# Patient Record
Sex: Female | Born: 1965 | Race: White | Hispanic: No | Marital: Married | Smoking: Former smoker
Health system: Southern US, Community
[De-identification: ages and names within clinical notes are randomized; demographics above are authoritative.]

## PROBLEM LIST (undated history)

## (undated) DIAGNOSIS — B019 Varicella without complication: Secondary | ICD-10-CM

## (undated) DIAGNOSIS — N189 Chronic kidney disease, unspecified: Secondary | ICD-10-CM

## (undated) DIAGNOSIS — E785 Hyperlipidemia, unspecified: Secondary | ICD-10-CM

## (undated) DIAGNOSIS — E282 Polycystic ovarian syndrome: Secondary | ICD-10-CM

## (undated) DIAGNOSIS — E039 Hypothyroidism, unspecified: Secondary | ICD-10-CM

## (undated) DIAGNOSIS — D259 Leiomyoma of uterus, unspecified: Secondary | ICD-10-CM

## (undated) DIAGNOSIS — D649 Anemia, unspecified: Secondary | ICD-10-CM

## (undated) DIAGNOSIS — G43909 Migraine, unspecified, not intractable, without status migrainosus: Secondary | ICD-10-CM

## (undated) DIAGNOSIS — C801 Malignant (primary) neoplasm, unspecified: Secondary | ICD-10-CM

## (undated) DIAGNOSIS — K2970 Gastritis, unspecified, without bleeding: Secondary | ICD-10-CM

## (undated) DIAGNOSIS — I10 Essential (primary) hypertension: Secondary | ICD-10-CM

## (undated) HISTORY — DX: Migraine, unspecified, not intractable, without status migrainosus: G43.909

## (undated) HISTORY — PX: OTHER SURGICAL HISTORY: SHX169

## (undated) HISTORY — PX: DILATION AND CURETTAGE OF UTERUS: SHX78

## (undated) HISTORY — DX: Hyperlipidemia, unspecified: E78.5

## (undated) HISTORY — PX: CARPAL TUNNEL RELEASE: SHX101

## (undated) HISTORY — DX: Essential (primary) hypertension: I10

---

## 2004-06-08 ENCOUNTER — Ambulatory Visit: Payer: Self-pay | Admitting: Family Medicine

## 2006-08-03 ENCOUNTER — Ambulatory Visit: Payer: Self-pay | Admitting: Unknown Physician Specialty

## 2007-02-20 ENCOUNTER — Ambulatory Visit: Payer: Self-pay | Admitting: Unknown Physician Specialty

## 2007-03-02 ENCOUNTER — Ambulatory Visit: Payer: Self-pay | Admitting: Unknown Physician Specialty

## 2007-12-11 ENCOUNTER — Ambulatory Visit: Payer: Self-pay | Admitting: Unknown Physician Specialty

## 2008-12-16 ENCOUNTER — Ambulatory Visit: Payer: Self-pay | Admitting: Unknown Physician Specialty

## 2009-01-20 ENCOUNTER — Ambulatory Visit: Payer: Self-pay | Admitting: Internal Medicine

## 2009-02-04 ENCOUNTER — Ambulatory Visit: Payer: Self-pay | Admitting: Internal Medicine

## 2009-02-19 ENCOUNTER — Ambulatory Visit: Payer: Self-pay | Admitting: Internal Medicine

## 2009-04-22 ENCOUNTER — Ambulatory Visit: Payer: Self-pay | Admitting: Internal Medicine

## 2009-05-14 ENCOUNTER — Ambulatory Visit: Payer: Self-pay | Admitting: Internal Medicine

## 2009-05-22 ENCOUNTER — Ambulatory Visit: Payer: Self-pay | Admitting: Internal Medicine

## 2009-11-20 ENCOUNTER — Ambulatory Visit: Payer: Self-pay | Admitting: Internal Medicine

## 2009-12-09 ENCOUNTER — Ambulatory Visit: Payer: Self-pay | Admitting: Internal Medicine

## 2009-12-20 ENCOUNTER — Ambulatory Visit: Payer: Self-pay | Admitting: Internal Medicine

## 2010-02-09 ENCOUNTER — Ambulatory Visit: Payer: Self-pay | Admitting: Unknown Physician Specialty

## 2010-07-02 ENCOUNTER — Ambulatory Visit: Payer: Self-pay | Admitting: Unknown Physician Specialty

## 2011-03-29 ENCOUNTER — Ambulatory Visit: Payer: Self-pay | Admitting: Family Medicine

## 2012-02-22 ENCOUNTER — Ambulatory Visit: Payer: Self-pay | Admitting: Unknown Physician Specialty

## 2012-02-22 LAB — HCG, QUANTITATIVE, PREGNANCY: Beta Hcg, Quant.: <1 m[IU]/mL — ABNORMAL LOW

## 2012-07-30 ENCOUNTER — Ambulatory Visit: Payer: Self-pay | Admitting: Specialist

## 2012-08-14 ENCOUNTER — Ambulatory Visit: Payer: Self-pay | Admitting: Specialist

## 2012-09-06 ENCOUNTER — Ambulatory Visit: Payer: Self-pay | Admitting: Family Medicine

## 2013-08-07 ENCOUNTER — Ambulatory Visit: Payer: Self-pay | Admitting: Specialist

## 2013-08-07 LAB — BASIC METABOLIC PANEL
Anion Gap: 5 — ABNORMAL LOW (ref 7–16)
BUN: 8 mg/dL (ref 7–18)
Calcium, Total: 9.4 mg/dL (ref 8.5–10.1)
Chloride: 105 mmol/L (ref 98–107)
Co2: 29 mmol/L (ref 21–32)
EGFR (African American): >60
EGFR (Non-African Amer.): >60
Potassium: 3.6 mmol/L (ref 3.5–5.1)
Sodium: 139 mmol/L (ref 136–145)

## 2013-08-16 ENCOUNTER — Ambulatory Visit: Payer: Self-pay | Admitting: Specialist

## 2013-08-22 DIAGNOSIS — C801 Malignant (primary) neoplasm, unspecified: Secondary | ICD-10-CM

## 2013-08-22 HISTORY — DX: Malignant (primary) neoplasm, unspecified: C80.1

## 2014-01-20 HISTORY — PX: APPENDECTOMY: SHX54

## 2014-02-10 ENCOUNTER — Observation Stay: Payer: Self-pay | Admitting: Surgery

## 2014-02-10 LAB — CBC
HCT: 39.8 % (ref 35.0–47.0)
HGB: 13.5 g/dL (ref 12.0–16.0)
MCH: 30.6 pg (ref 26.0–34.0)
MCHC: 34 g/dL (ref 32.0–36.0)
MCV: 90 fL (ref 80–100)
Platelet: 302 10*3/uL (ref 150–440)
RBC: 4.42 10*6/uL (ref 3.80–5.20)
RDW: 13.8 % (ref 11.5–14.5)
WBC: 27.9 10*3/uL — AB (ref 3.6–11.0)

## 2014-02-10 LAB — COMPREHENSIVE METABOLIC PANEL
ALBUMIN: 4 g/dL (ref 3.4–5.0)
ALT: 34 U/L (ref 12–78)
ANION GAP: 12 (ref 7–16)
Alkaline Phosphatase: 99 U/L
BILIRUBIN TOTAL: 0.8 mg/dL (ref 0.2–1.0)
BUN: 12 mg/dL (ref 7–18)
Calcium, Total: 9.6 mg/dL (ref 8.5–10.1)
Chloride: 101 mmol/L (ref 98–107)
Co2: 26 mmol/L (ref 21–32)
Creatinine: 1.01 mg/dL (ref 0.60–1.30)
EGFR (African American): >60
EGFR (Non-African Amer.): >60
GLUCOSE: 143 mg/dL — AB (ref 65–99)
OSMOLALITY: 280 (ref 275–301)
Potassium: 3.2 mmol/L — ABNORMAL LOW (ref 3.5–5.1)
SGOT(AST): 27 U/L (ref 15–37)
Sodium: 139 mmol/L (ref 136–145)
TOTAL PROTEIN: 7.8 g/dL (ref 6.4–8.2)

## 2014-02-10 LAB — URINALYSIS, COMPLETE
BACTERIA: NONE SEEN
BILIRUBIN, UR: NEGATIVE
Blood: NEGATIVE
Hyaline Cast: 2
LEUKOCYTE ESTERASE: NEGATIVE
Nitrite: NEGATIVE
Ph: 7 (ref 4.5–8.0)
Protein: NEGATIVE
RBC,UR: 3 /HPF (ref 0–5)
SPECIFIC GRAVITY: 1.021 (ref 1.003–1.030)
Squamous Epithelial: 1
WBC UR: 1 /HPF (ref 0–5)

## 2014-02-10 LAB — HCG, QUANTITATIVE, PREGNANCY: Beta Hcg, Quant.: <1 m[IU]/mL — ABNORMAL LOW

## 2014-02-10 LAB — LIPASE, BLOOD: LIPASE: 244 U/L (ref 73–393)

## 2014-02-10 LAB — WET PREP, GENITAL

## 2014-02-10 LAB — GC/CHLAMYDIA PROBE AMP

## 2014-02-11 LAB — PATHOLOGY REPORT

## 2014-02-15 LAB — CULTURE, BLOOD (SINGLE)

## 2014-04-22 HISTORY — PX: PARTIAL NEPHRECTOMY: SHX414

## 2014-12-09 NOTE — Op Note (Signed)
PATIENT NAMECHIKITA, Teresa Short MR#:  [id number] DATE OF BIRTH:  31-Jan-1966  DATE OF PROCEDURE:  08/14/2012  PREOPERATIVE DIAGNOSIS: Right carpal tunnel syndrome.   POSTOPERATIVE DIAGNOSIS: Right carpal tunnel syndrome.   PROCEDURE: Right carpal tunnel release.   SURGEON: Christophe Louis, M.D.   ANESTHESIA: General.   COMPLICATIONS: None.   TOURNIQUET TIME: 12 minutes.   DESCRIPTION OF PROCEDURE: After adequate induction of general anesthesia, the right upper extremity is thoroughly prepped with alcohol and ChloraPrep and draped in standard sterile fashion. The extremity is wrapped out with the Esmarch bandage and pneumatic tourniquet elevated to 250 mmHg. Under loupe magnification, standard volar carpal tunnel incision is made and the dissection carefully carried down to the transverse retinacular ligament. This is incised in the midportion. The distal release is performed with the small scissors. The proximal release is performed with the small scissors and the carpal tunnel scissors. There is seen to be moderate compression of the nerve directly beneath the ligament. Careful search is made both proximally and distally to ensure that complete release had been obtained. The wound is thoroughly irrigated multiple times. Skin edges are infiltrated with 0.5% plain Marcaine. The skin is closed with 4-0 nylon. A soft bulky dressing is applied. The tourniquet is released. The patient is returned to the recovery room in satisfactory condition having tolerated the procedure quite well.   ____________________________ Lucas Mallow, MD ces:jm D: 08/14/2012 14:05:46 ET T: 08/14/2012 14:40:10 ET JOB#: [id number]  cc: Lucas Mallow, MD, <Dictator> Lucas Mallow MD ELECTRONICALLY SIGNED 08/14/2012 15:06

## 2014-12-12 NOTE — Op Note (Signed)
PATIENT NAMEAMESHIA, Teresa Short MR#:  [id number] DATE OF BIRTH:  1965-09-05  DATE OF PROCEDURE:  08/16/2013  PREOPERATIVE DIAGNOSIS: Left carpal tunnel syndrome.   POSTOPERATIVE DIAGNOSIS: Left carpal tunnel syndrome.   PROCEDURE: Left carpal tunnel release.   SURGEON: Christophe Louis, M.D.   ANESTHESIA: General.   COMPLICATIONS: None.   TOURNIQUET TIME: Approximately 15 minutes.   DESCRIPTION OF PROCEDURE: After adequate induction of general anesthesia, the left upper extremity is thoroughly prepped with alcohol and ChloraPrep and draped in standard sterile fashion. The extremity is wrapped out with the Esmarch bandage and pneumatic tourniquet elevated to 250 mmHg. Under loupe magnification, standard volar carpal tunnel incision is made. The dissection is carefully carried down to the transverse retinacular ligament. This is incised in the midportion with the knife. The proximal release is performed with the small scissors and the carpal tunnel scissors. The distal release is performed with the small scissors. There is seen to be moderate compression of the nerve directly beneath the ligament. There is mild synovitis present. There is no mass lesion present. Careful check is made both proximally and distally to ensure that complete release had been obtained. The wound is thoroughly irrigated multiple times. Skin edges are infiltrated with 0.5% plain Marcaine. The skin is closed with 4-0 nylon. A soft bulky dressing is applied. The tourniquet is released. The patient is returned to the recovery room in satisfactory condition having tolerated the procedure quite well.   ____________________________ Lucas Mallow, MD ces:aw D: 08/16/2013 08:33:53 ET T: 08/16/2013 08:44:01 ET JOB#: [id number]  cc: Lucas Mallow, MD, <Dictator> Lucas Mallow MD ELECTRONICALLY SIGNED 08/17/2013 14:11

## 2014-12-13 NOTE — H&P (Signed)
PATIENT NAMEPARISS, HOMMES MR#:  [id number] DATE OF BIRTH:  [birth year]  DATE OF ADMISSION:  02/10/2014  CHIEF COMPLAINT: Right flank pain.   HISTORY OF PRESENT ILLNESS: This is a patient with a right flank pain that started more in the right upper quadrant and is now in the right lower quadrant and right flank that started yesterday morning at 8:00 a.m. She had no problems the day before. She has never had an episode like this before. She states that it is worsening. She has had nausea and a single emesis before coming to the hospital. Denies fevers or chills. Denies hematuria.   A work-up in the Emergency Room has been with an ultrasound and a CT scan. CT scan confirms the presence of  acute appendicitis, but also shows a left renal mass suspicious for left renal cell carcinoma.   The patient is aware of these findings.   PAST MEDICAL HISTORY: Hypertension, hypothyroidism, obesity, tobacco abuse, but she states that she is stopping today.   PAST SURGICAL HISTORY: Bilateral carpal tunnel surgery, tonsillectomy.   ALLERGIES: The patient states that she had known medical allergies, but on her intake she shows [place].   MEDICATIONS: Multiple, see chart and med reconciliation.   FAMILY HISTORY: Noncontributory.   SOCIAL HISTORY: The patient is a Pharmacist, hospital. Smokes six cigarettes per day, but states that she is stopping today. Does not drink alcohol.   REVIEW OF SYSTEMS: Ten system review is performed and negative with the exception of that mentioned in the history of present illness.   PHYSICAL EXAMINATION: GENERAL: Morbidly obese female patient with a BMI of 37, 240 pounds, 68 inches tall.  VITAL SIGNS: Temperature of 97.7, pulse of 87, respirations 18, blood pressure 139/75. Pain scale of 0 recorded in the nursing notes, but she clearly has pain and is tender. See below. Her pain scale was a 10. HEENT: Shows no scleral icterus.  NECK: No palpable neck nodes.  CHEST: Clear to  auscultation.  CARDIAC: Regular rate and rhythm.  ABDOMEN: Soft. There is tenderness in the right lower quadrant with positive percussion tenderness and some guarding and some rebound tenderness. Questionable Rovsing sign is present as well. No scars are noted.  EXTREMITIES: Without edema.  NEUROLOGIC: Grossly intact.  INTEGUMENT: No jaundice.   LABORATORY VALUES: An ultrasound of the pelvis is reviewed, showing ovarian cyst bilaterally.   CT scan of the abdomen and pelvis demonstrates what appears to be acute appendicitis. There is also a 3.5 cm left renal mass suspicious for renal cell carcinoma. No hydroureter.   Urinalysis shows 3 red blood cells but no gross blood.   Electrolytes are within normal limits with the exception of serum potassium of 3.2. White blood cell count is 28,000, H and H of 13.5 and 40 and a platelet count of 302.   ASSESSMENT AND PLAN: This is a patient with classic symptoms of acute appendicitis. She is most tender at McBurney's point, but her pain and tenderness radiates to her right flank. I have recommended laparoscopic appendectomy. The rationale for this has been discussed with she and her family. The options of observation have been reviewed. The risks of rupture has been reviewed and the risks of bleeding, infection, recurrent symptoms, failure to resolve her symptoms, negative laparoscopy and conversion to an open procedure have all been reviewed. She and her family understood and agreed to proceed. We will admit to the hospital and scheduled for Dr. Leanora Cover later this morning.   Also  of note is the coincidental findings of a left renal mass. The patient is aware of these findings I discussed with her possible urology consult while she is in the hospital versus outpatient versus tertiary care referral of which she would prefer [facility] if she were to be referred out of [facility] for this care. She is aware that this is suspicious for a cancer  and needs follow-up, as is her family.   ____________________________ Jerrol Banana Burt Knack, MD rec:sg D: 02/10/2014 05:10:22 ET T: 02/10/2014 06:20:56 ET JOB#: [id number]  cc: Jerrol Banana. Burt Knack, MD, <Dictator> Florene Glen MD ELECTRONICALLY SIGNED 02/17/2014 7:34

## 2014-12-13 NOTE — Consult Note (Signed)
Patient name: Teresa Short  Date of birth: [birth year]  Date of consult: February 11, 2014  Reason for consult: Left renal mass  History:  Ms. Grilli is a 49 year old white female who presented with right sided flank pain.  She underwent evaluation by general surgery.  She was found to have acute appendicitis.  She underwent evaluation including a CT scan.  This was performed with contrast.  This demonstrated findings consistent with acute appendicitis.  She was also found to have a 3.5 cm mass in the anterior portion of the left kidney.  It has changes worrisome for possible renal cell carcinoma.  There is no significant perirenal or aortic adenopathy.  The lower lung fields demonstrate no evidence of metastasis.  The study performed was not optimal for evaluation of the renal tumor.  A triphasic CT scan is more appropriate for complete evaluation.  She has had no significant flank pain or other symptoms to suggest the tumor.  She denies any known family history of renal cell carcinoma.  Her maternal grandmother underwent a nephrectomy for unknown reasons.  It was not felt to be malignancy related.  She has had no other imaging for comparison in the recent past.  The findings of a renal mass have been discussed in detail.  Malignant and benign lesions were discussed in detail.  The potential need for further intervention was also discussed.  The options of radical nephrectomy, partial nephrectomy, or cryotherapy was discussed.  Given her age and health and size of tumor, partial nephrectomy would be the gold standard of therapy.  She does have a risk factor of tobacco use.  She denies any other significant prior urological history.       Past medical history: Hypertension, hypothyroidism, obesity, tobacco use.  Past surgical history: Appendectomy, bilateral carpal tunnel surgery, tonsillectomy  Social history: Patient has a long history of tobacco use.  She denies any significant alcohol or drug  use.  Family history: Noncontributory  Medications on admission: Zyrtec 10 mg daily, vitamin D3 1000 units daily, vitamin B-12 1000 g daily, Tylenol PM once at bedtime, tramadol 50 mg 2 tabs every 4 hours as needed, multivitamin once daily, levothyroxine 75 g daily, hydrochlorothiazide 25 mg daily, calcium with vitamin D 1 tab daily, BuSpar 150 mg twice daily, Drisdol 7.5 mg daily, biotin 1000 g daily  Allergies: Triaminic, latex  Physical examination:      Temp: 97.5 heart rate: 69  respiratory rate: 16  blood pressure: 132/82 Gen.: Awake, alert, oriented 4 HEENT: Within normal limits Chest: Clear to auscultation bilaterally.  No abnormal breath sounds Cardiovascular: Regular rate and rhythm Abdomen: Soft, mildly tender in the right lower quadrant, nondistended, no palpable masses, no appreciable CVA tenderness GU: Within normal limits Extremities: Free range of motion 4 Neuro: Motor and sensory grossly intact  Assessment: Left 3.5 cm renal mass  Recommendation: Further evaluation will be needed with a triphasic CT scan of the abdomen for better definition of the tumor and enhancement.  Given the size of the lesion, renal cell carcinoma is of most concern. The history of smoking also increases the probability of malignancy.  The option of radical nephrectomy, partial nephrectomy, or cryotherapy has been discussed in detail.  We will further evaluate the lesion prior to making a final decision.  We will give her some time to recover from the appendectomy.  We will schedule a triphasic CT scan of the abdomen in the next 2 weeks.  She is to follow-up  after the CT scan for interpretation and further recommendation of treatment.  We will likely refer to [place] for glottic partial nephrectomy.  She is in agreement with this course of action.  She is to notify us if there are any further problems or questions in the interim.  Electronic Signatures: Murrell Redden (MD)  (Signed on  23-Jun-15 11:54)  Authored  Last Updated: 23-Jun-15 11:54 by Murrell Redden (MD)

## 2014-12-13 NOTE — Consult Note (Signed)
Pt seen, Chart Reviewed. Notes Reviewed. Note DIctated. Left Renal Mass  Pt will need dedicated triphasic renal CT for better evaluation of the renal mass.  Worrisome for renal cell carcinoma.  Will arrange CT outpatient in the next two weeks with follow up afterwards.  Will likley need robotic partial nephrectomy.  We will arrange at [facility] in the near future.   Electronic Signatures: Murrell Redden (MD)  (Signed on 23-Jun-15 08:19)  Authored  Last Updated: 23-Jun-15 08:19 by Murrell Redden (MD)

## 2014-12-13 NOTE — H&P (Signed)
Subjective/Chief Complaint rt flank pain   History of Present Illness 18 hrs rt flank pain, started RUQ now in RLQ. No prior episode, no f/c no hematuria nausea and emesis prehospital   Past History PMH HTN, hypothyrouid obesity PSH carpal tunnel bilat, T&A   Past Medical Health Hypertension, Smoking   Past Med/Surgical Hx:  anemia:   thyroid:   Carpal Tunnel Release:   tonsillectomy:   uterine abblation:   ALLERGIES:  Triaminic: Hives  Latex: Rash, Itching  Family and Social History:  Family History Non-Contributory   Social History positive  tobacco, positive tobacco (Greater than 1 year), negative ETOH, teacher   + Tobacco Current (within 1 year)   Place of Living Home   Review of Systems:  Fever/Chills No   Cough No   Abdominal Pain Yes   Diarrhea No   Constipation No   Nausea/Vomiting Yes   SOB/DOE No   Chest Pain No   Dysuria No   Tolerating Diet No  Nauseated  Vomiting   Medications/Allergies Reviewed Medications/Allergies reviewed   Physical Exam:  GEN no acute distress, obese   HEENT pink conjunctivae   NECK supple   RESP normal resp effort  clear BS  postive use of accessory muscles   CARD regular rate   ABD positive tenderness  soft  RLQ with perc tenderness   LYMPH negative neck   EXTR negative edema   SKIN normal to palpation   PSYCH alert, A+O to time, place, person, good insight   Lab Results: Hepatic:  22-Jun-15 00:35   Bilirubin, Total 0.8  Alkaline Phosphatase 99 (45-117 NOTE: New Reference Range 07/12/13)  SGPT (ALT) 34  SGOT (AST) 27  Total Protein, Serum 7.8  Albumin, Serum 4.0  Routine Micro:  22-Jun-15 01:08   Micro Text Report CHLAM/N.GC RT-PCR ([facility])   CHLAMYDIA                 CHLAMYDIA TRACHOMATIS NEGATIVE   N.GONORRHOEAE             N.GONORRHOEAE NEGATIVE   ANTIBIOTIC                       Micro Text Report WET PREP   COMMENT                   RARE WHITE BLOOD CELLS SEEN   COMMENT                    NO TRICHOMONAS,SPERMATOZOA,YEAST,OR CLUE CELLS SEEN   ANTIBIOTIC                       Comment 1. RARE WHITE BLOOD CELLS SEEN  Comment 2. NO TRICHOMONAS,SPERMATOZOA,YEAST,OR CLUE CELLS SEEN  Result(s) reported on 10 Feb 2014 at 01:43AM.  Routine Chem:  22-Jun-15 00:35   Glucose, Serum  143  BUN 12  Creatinine (comp) 1.01  Sodium, Serum 139  Potassium, Serum  3.2  Chloride, Serum 101  CO2, Serum 26  Calcium (Total), Serum 9.6  Osmolality (calc) 280  eGFR (African American) >60  eGFR (Non-African American) >60 (eGFR values <79m/min/1.73 m2 may be an indication of chronic kidney disease (CKD). Calculated eGFR is useful in patients with stable renal function. The eGFR calculation will not be reliable in acutely ill patients when serum creatinine is changing rapidly. It is not useful in  patients on dialysis. The eGFR calculation may not be applicable to patients at the low  and high extremes of body sizes, pregnant women, and vegetarians.)  Anion Gap 12  HCG Betasubunit Quant. Serum  < 1 (1-3  (International Unit)  ----------------- Non-pregnant <5 Weeks Post LMP mIU/mL  3- 4 wk 9 - 130  4- 5 wk 75 - 2,600  5- 6 wk 850 - 20,800  6- 7 wk 4,000 - 100,000  7-12 wk 11,500 - 289,000 12-16 wk 18,000 - 137,000 16-29 wk 1,400 - 53,000 29-41 wk 940 - 60,000)  Lipase 244 (Result(s) reported on 10 Feb 2014 at 01:14AM.)  Routine UA:  22-Jun-15 02:46   Color (UA) Yellow  Clarity (UA) Cloudy  Glucose (UA) 50 mg/dL  Bilirubin (UA) Negative  Ketones (UA) Trace  Specific Gravity (UA) 1.021  Blood (UA) Negative  pH (UA) 7.0  Protein (UA) Negative  Nitrite (UA) Negative  Leukocyte Esterase (UA) Negative (Result(s) reported on 10 Feb 2014 at 04:03AM.)  RBC (UA) 3 /HPF  WBC (UA) 1 /HPF  Bacteria (UA) NONE SEEN  Epithelial Cells (UA) 1 /HPF  Mucous (UA) PRESENT  Hyaline Cast (UA) 2 /LPF  Amorphous Crystal (UA) PRESENT (Result(s) reported on 10 Feb 2014 at 04:03AM.)   Routine Hem:  22-Jun-15 00:35   WBC (CBC)  27.9  RBC (CBC) 4.42  Hemoglobin (CBC) 13.5  Hematocrit (CBC) 39.8  Platelet Count (CBC) 302 (Result(s) reported on 10 Feb 2014 at 01:40AM.)  MCV 90  MCH 30.6  MCHC 34.0  RDW 13.8   Radiology Results: [place]:    22-Jun-15 02:12, US Pelvis Ultrasound Exam with Transvaginal - NON-OB  US Pelvis Ultrasound Exam with Transvaginal - NON-OB  REASON FOR EXAM:    R lower quad and flank pain, history of ovarian cysts  COMMENTS:   LMP: 9 years ago    PROCEDURE: [place]  - US PELVIS EXAM W/TRANSVAGINAL  - Feb 10 2014  2:12AM     CLINICAL DATA:  Right pelvic pain, history of ovarian cyst.    EXAM:  TRANSABDOMINAL AND TRANSVAGINAL ULTRASOUND OF PELVIS    TECHNIQUE:  Both transabdominal and transvaginal ultrasound examinations of the  pelvis were performed. Transabdominal technique was performed for  global imaging of the pelvis including uterus,ovaries, adnexal  regions, and pelvic cul-de-sac. It was necessary to proceed with  endovaginal exam following the transabdominal exam to visualize the  adnexa.    COMPARISON:  Ultrasound July 02, 2010    FINDINGS:  Uterus    Measurements: 7.1 x 3.2 x 4.2 cm. 3.5 x 2.8 cm fundal intramural  slightly echogenic vascular leiomyoma. Subcentimeter leiomyoma  within the lower uterine segment is likely intramural. Nabothian  cysts at the cervix.    Endometrium  Thickness: 6 mm.  No focal abnormalityvisualized.    Right ovary    Measurements: 2.4 x 1.7 x 4.5 cm. 2.1 x 2 cm right parovarian cyst.    Left ovary    Measurements: 5.1 x 2.5 x 3.5 cm. 4 x 2.3 x 3.5 cm anechoic  avascular left adnexal cyst with increased through transmission.    Other findings    No free fluid.   IMPRESSION:  Bilateral benign-appearing adnexal cyst, measuring up to 4 cm on the  left. Slightly septated 2.1 x 2 cm right parovarian cyst, Stable is  slightly increased in size.    Multiple uterine  leiomyomas.      Electronically Signed    By: Elon Alas    On: 02/10/2014 03:20         Verified By: Carney Corners.  BLOOMER, M.D.,  CT:    22-Jun-15 04:04, CT Abdomen and Pelvis With Contrast  CT Abdomen and Pelvis With Contrast  REASON FOR EXAM:    (1) RLQ pain, elevated WBC count; (2) RLQ pain,   elevated WBC count  COMMENTS:   May transport without cardiac monitor    PROCEDURE: CT  - CT ABDOMEN / PELVIS  W  - Feb 10 2014  4:04AM     CLINICAL DATA:  Right back pain and flank pain all day. Nausea and  vomiting. Elevated white cell count.    EXAM:  CT ABDOMEN AND PELVIS WITH CONTRAST    TECHNIQUE:  Multidetector CT imaging of the abdomen and pelvis was performed  using the standard protocol following bolus administration of  intravenous contrast.    CONTRAST:  100 mL Isovue 300    COMPARISON:  Ultrasound pelvis 02/10/2014    FINDINGS:  The lung bases are clear.    Diffuse fatty infiltration of the liver. The gallbladder, pancreas,  spleen, adrenal glands, abdominal aorta, inferior vena cava, and  retroperitoneal lymph nodes are unremarkable. There is a solid  heterogeneously hypo enhancing mass in the midpole of the left  kidney measuring 3.5 cm diameter. The appearance is suspicious for  renal cell carcinoma. No hydronephrosis in either kidney. No  retroperitoneal lymphadenopathy. No evidence of venous tumor  invasion. The stomach, small bowel, and colon are decompressed. No  free air or free fluid in the abdomen.    Pelvis: The appendix is distended with appendiceal diameter  measuring about 15 mm. There is periappendiceal infiltration and  edema. Changes are consistent with acute appendicitis. No evidence  of periappendiceal abscess or loculated fluid collection. Nodular  appearance of the uterus consistent with fibroids. No abnormal  adnexal masses. No free or loculated pelvic fluid collections. No  destructive bone lesions.     IMPRESSION:  1.   Changes of acute appendicitis without abscess.  2. 3.5 cm mass in the left kidney consistent with renal cell  carcinoma.    3.  Fatty infiltration of the liver.      Electronically Signed    By: Lucienne Capers M.D.    On: 02/10/2014 04:12         Verified By: Neale Burly, M.D.,    Assessment/Admission Diagnosis acute appendicitis byu Hx, PE and confirmed with CT scan rec lap appy; options rationale and risks in detail VTE prophylaxis in obese smoker  also has inc finding of left renal mass; will ask for urology consult. susp for RCCa   Electronic Signatures: Florene Glen (MD)  (Signed 22-Jun-15 05:18)  Authored: CHIEF COMPLAINT and HISTORY, PAST MEDICAL/SURGIAL HISTORY, ALLERGIES, FAMILY AND SOCIAL HISTORY, REVIEW OF SYSTEMS, PHYSICAL EXAM, LABS, Radiology, ASSESSMENT AND PLAN   Last Updated: 22-Jun-15 05:18 by Florene Glen (MD)

## 2014-12-13 NOTE — Op Note (Signed)
PATIENT NAMESYANNE, Teresa Short MR#:  [id number] DATE OF BIRTH:  July 06, 1966  DATE OF PROCEDURE:  02/10/2014  PREOPERATIVE DIAGNOSIS:  Acute appendicitis.   POSTOPERATIVE DIAGNOSIS:  Acute appendicitis.   SURGEON:  Consuela Mimes, M.D.   ANESTHESIA:  General.   PROCEDURE IN DETAIL:  The patient was placed supine on the operating room table and prepped and draped in the usual sterile fashion. A Hasson cannula was introduced amidst horizontal mattress sutures of 0 Vicryl in the supraumbilical midline and a 15 mmHg CO2 pneumoperitoneum was created. Two additional 5 mm trocars were placed under direct visualization. There were some adhesions to the cecum and ascending colon in the right flank parietal peritoneum that appeared chronic, and these were taken down with the Harmonic scalpel, and then the appendix was located. There was a little bit of fibrinous exudate on it, but it was not ruptured and not dead. The mesoappendix was divided with the Harmonic scalpel and an appendectomy was performed just where the appendix met the cecum at its very base with an Endo GIA stapling device. The appendix was placed in an Endo Catch bag and extracted from the abdomen via the supraumbilical port site. The right lower quadrant was irrigated with copious amounts of warm normal saline and this was completely suctioned out including in the pelvis where there was no pus. The patient was noted to have a moderate-sized fibroid tumor of the uterus on the fundus of the uterus, as well as a right paratubal cyst, which was about 1 cm in diameter and appeared completely benign. The omentum was dragged over top of the appendiceal stump, as was some antimesenteric fat from the terminal ileum, and the peritoneum was desufflated and decannulated. The linea alba was closed with a single interrupted 0 PDS suture and the previously placed 0 Vicryls and all 3 skin sites were closed with subcuticular 5-0 Monocryl and suture strips. The  patient tolerated the procedure well and there were no complications.     ____________________________ Consuela Mimes, MD wfm:dmm D: 02/10/2014 10:54:48 ET T: 02/10/2014 11:06:02 ET JOB#: [id number]  cc: Consuela Mimes, MD, <Dictator> Consuela Mimes MD ELECTRONICALLY SIGNED 02/10/2014 11:29

## 2014-12-23 ENCOUNTER — Other Ambulatory Visit: Payer: Self-pay | Admitting: Nurse Practitioner

## 2014-12-23 DIAGNOSIS — R1011 Right upper quadrant pain: Secondary | ICD-10-CM

## 2014-12-23 DIAGNOSIS — R110 Nausea: Secondary | ICD-10-CM

## 2014-12-24 ENCOUNTER — Ambulatory Visit
Admission: RE | Admit: 2014-12-24 | Discharge: 2014-12-24 | Disposition: A | Discharge status: Home / Self Care | Payer: BLUE CROSS/BLUE SHIELD | Source: Ambulatory Visit | Attending: Nurse Practitioner | Admitting: Nurse Practitioner

## 2014-12-24 DIAGNOSIS — R110 Nausea: Secondary | ICD-10-CM | POA: Diagnosis not present

## 2014-12-24 DIAGNOSIS — K760 Fatty (change of) liver, not elsewhere classified: Secondary | ICD-10-CM | POA: Diagnosis not present

## 2014-12-24 DIAGNOSIS — R1011 Right upper quadrant pain: Secondary | ICD-10-CM | POA: Insufficient documentation

## 2015-02-20 ENCOUNTER — Encounter: Payer: Self-pay | Admitting: *Deleted

## 2015-02-24 ENCOUNTER — Encounter: Payer: Self-pay | Admitting: *Deleted

## 2015-02-24 ENCOUNTER — Ambulatory Visit: Payer: BLUE CROSS/BLUE SHIELD | Admitting: Anesthesiology

## 2015-02-24 ENCOUNTER — Ambulatory Visit
Admission: RE | Admit: 2015-02-24 | Discharge: 2015-02-24 | Disposition: A | Discharge status: Home / Self Care | Payer: BLUE CROSS/BLUE SHIELD | Source: Ambulatory Visit | Attending: Gastroenterology | Admitting: Gastroenterology

## 2015-02-24 ENCOUNTER — Encounter
Admission: RE | Disposition: A | Discharge status: Home / Self Care | Payer: Self-pay | Source: Ambulatory Visit | Attending: Gastroenterology

## 2015-02-24 DIAGNOSIS — R1013 Epigastric pain: Secondary | ICD-10-CM | POA: Insufficient documentation

## 2015-02-24 DIAGNOSIS — Z791 Long term (current) use of non-steroidal anti-inflammatories (NSAID): Secondary | ICD-10-CM | POA: Diagnosis not present

## 2015-02-24 DIAGNOSIS — E039 Hypothyroidism, unspecified: Secondary | ICD-10-CM | POA: Insufficient documentation

## 2015-02-24 DIAGNOSIS — R109 Unspecified abdominal pain: Secondary | ICD-10-CM | POA: Diagnosis present

## 2015-02-24 DIAGNOSIS — E282 Polycystic ovarian syndrome: Secondary | ICD-10-CM | POA: Diagnosis not present

## 2015-02-24 DIAGNOSIS — R110 Nausea: Secondary | ICD-10-CM | POA: Diagnosis present

## 2015-02-24 DIAGNOSIS — K219 Gastro-esophageal reflux disease without esophagitis: Secondary | ICD-10-CM | POA: Diagnosis not present

## 2015-02-24 DIAGNOSIS — Z79899 Other long term (current) drug therapy: Secondary | ICD-10-CM | POA: Diagnosis not present

## 2015-02-24 DIAGNOSIS — K224 Dyskinesia of esophagus: Secondary | ICD-10-CM | POA: Diagnosis not present

## 2015-02-24 DIAGNOSIS — Z91040 Latex allergy status: Secondary | ICD-10-CM | POA: Insufficient documentation

## 2015-02-24 DIAGNOSIS — K2900 Acute gastritis without bleeding: Secondary | ICD-10-CM | POA: Insufficient documentation

## 2015-02-24 HISTORY — DX: Anemia, unspecified: D64.9

## 2015-02-24 HISTORY — DX: Hypothyroidism, unspecified: E03.9

## 2015-02-24 HISTORY — DX: Polycystic ovarian syndrome: E28.2

## 2015-02-24 HISTORY — DX: Leiomyoma of uterus, unspecified: D25.9

## 2015-02-24 HISTORY — PX: ESOPHAGOGASTRODUODENOSCOPY: SHX5428

## 2015-02-24 HISTORY — DX: Malignant (primary) neoplasm, unspecified: C80.1

## 2015-02-24 HISTORY — DX: Varicella without complication: B01.9

## 2015-02-24 SURGERY — EGD (ESOPHAGOGASTRODUODENOSCOPY)
Anesthesia: General

## 2015-02-24 MED ORDER — MIDAZOLAM HCL 2 MG/2ML IJ SOLN
INTRAMUSCULAR | Status: DC | PRN
  Administered 2015-02-24: 1 mg via INTRAVENOUS

## 2015-02-24 MED ORDER — SODIUM CHLORIDE 0.9 % IV SOLN: INTRAVENOUS | Status: DC

## 2015-02-24 MED ORDER — FENTANYL CITRATE (PF) 100 MCG/2ML IJ SOLN
INTRAMUSCULAR | Status: DC | PRN
  Administered 2015-02-24: 50 ug via INTRAVENOUS

## 2015-02-24 MED ORDER — LIDOCAINE HCL (CARDIAC) 20 MG/ML IV SOLN
INTRAVENOUS | Status: DC | PRN
  Administered 2015-02-24: 60 mg via INTRAVENOUS

## 2015-02-24 MED ORDER — GLYCOPYRROLATE 0.2 MG/ML IJ SOLN
INTRAMUSCULAR | Status: DC | PRN
  Administered 2015-02-24: 0.1 mg via INTRAVENOUS

## 2015-02-24 MED ORDER — SODIUM CHLORIDE 0.9 % IV SOLN
INTRAVENOUS | Status: DC
  Administered 2015-02-24: 10:00:00 via INTRAVENOUS

## 2015-02-24 MED ORDER — PROPOFOL INFUSION 10 MG/ML OPTIME
INTRAVENOUS | Status: DC | PRN
  Administered 2015-02-24: 120 ug/kg/min via INTRAVENOUS

## 2015-02-24 NOTE — Op Note (Signed)
[facility] Gastroenterology Patient Name: Teresa Short Procedure Date: 02/24/2015 11:17 AM MRN: [id number] Account #: [id number] Date of Birth: 29-Jun-1966 Admit Type: Outpatient Age: 49 Room: [facility] ENDO ROOM 3 Gender: Female Note Status: Finalized Procedure:         Upper GI endoscopy Indications:       Epigastric abdominal pain, Nausea Providers:         Lollie Sails, MD Referring MD:      Juanita Laster. Raynor (Referring MD) Medicines:         Monitored Anesthesia Care Complications:     No immediate complications. Procedure:         Pre-Anesthesia Assessment:                    - ASA Grade Assessment: III - A patient with severe                     systemic disease.                    After obtaining informed consent, the endoscope was passed                     under direct vision. Throughout the procedure, the                     patient's blood pressure, pulse, and oxygen saturations                     were monitored continuously. The Olympus GIF-160 endoscope                     (S#. [id number]) was introduced through the mouth, and                     advanced to the third part of duodenum. The upper GI                     endoscopy was accomplished without difficulty. The patient                     tolerated the procedure well. Findings:      The Z-line was variable. Biopsies were taken with a cold forceps for       histology.      Patchy moderate inflammation characterized by erosions, erythema and       friability was found in the gastric antrum. Biopsies were taken with a       cold forceps for histology. Biopsies were taken with a cold forceps for       Helicobacter pylori testing.      The cardia and gastric fundus were normal on retroflexion.      The examined duodenum was normal.      The exam was otherwise without abnormality. Impression:        - Z-line variable. Biopsied.                    - Erosive gastritis. Biopsied.                    -  Normal examined duodenum.                    - The examination was otherwise normal. Recommendation:    - Use Protonix (pantoprazole) 40 mg PO daily daily.                    -  hold relafen for 2 weeks Procedure Code(s): --- Professional ---                    [phone], Esophagogastroduodenoscopy, flexible, transoral;                     with biopsy, single or multiple Diagnosis Code(s): --- Professional ---                    530.89, Other specified disorders of esophagus                    535.40, Other specified gastritis, without mention of                     hemorrhage                    789.06, Abdominal pain, epigastric                    787.02, Nausea alone CPT copyright 2014 American [employer]. All rights reserved. The codes documented in this report are preliminary and upon coder review may  be revised to meet current compliance requirements. Lollie Sails, MD 02/24/2015 11:42:04 AM This report has been signed electronically. Number of Addenda: 0 Note Initiated On: 02/24/2015 11:17 AM      [facility]

## 2015-02-24 NOTE — H&P (Signed)
Outpatient short stay form Pre-procedure 02/24/2015 11:17 AM Teresa Sails MD  Primary Physician: Dr. Georgianne Fick  Reason for visit:  EGD  History of present illness:  Patient is a 49 year old female setting for evaluation of abdominal pain and nausea. The pain seems to be more so across the epigastrium and mostly left upper quadrant. Is also been having problems with nausea occurs about an hour after lunch or dinner not with every meal but frequently. She had been taking 800 mg ibuprofen. She is currently taking Relafen. He was just started on a proton pump inhibitor about 4 days ago. She states she is even noted some benefit at this point.    Current facility-administered medications:  .  0.9 %  sodium chloride infusion, , Intravenous, Continuous, Teresa Sails, MD, Last Rate: 50 mL/hr at 02/24/15 0945 .  0.9 %  sodium chloride infusion, , Intravenous, Continuous, Teresa Sails, MD  Prescriptions prior to admission  Medication Sig Dispense Refill Last Dose  . calcium carbonate (OS-CAL) 600 MG TABS tablet Take 600 mg by mouth daily with breakfast.     . calcium-vitamin D (OSCAL WITH D) 500-200 MG-UNIT per tablet Take 1 tablet by mouth.     . cetirizine (ZYRTEC) 10 MG tablet Take 10 mg by mouth daily.     . citalopram (CELEXA) 40 MG tablet Take 40 mg by mouth daily.     Marland Kitchen levothyroxine (SYNTHROID, LEVOTHROID) 75 MCG tablet Take 75 mcg by mouth daily before breakfast.     . Liraglutide -Weight Management 18 MG/3ML SOPN Inject into the skin 1 day or 1 dose.     . Multiple Vitamin (MULTIVITAMIN) tablet Take 1 tablet by mouth daily.     . nabumetone (RELAFEN) 500 MG tablet Take 500 mg by mouth 2 (two) times daily.     . ondansetron (ZOFRAN) 4 MG tablet Take 4 mg by mouth every 8 (eight) hours as needed for nausea or vomiting.     . pantoprazole (PROTONIX) 40 MG tablet Take 40 mg by mouth daily.     . traZODone (DESYREL) 50 MG tablet Take 50 mg by mouth at bedtime.     . vitamin B-12  (CYANOCOBALAMIN) 1000 MCG tablet Take 1,000 mcg by mouth daily.        Allergies  Allergen Reactions  . Latex      Past Medical History  Diagnosis Date  . Anemia   . Cancer   . Hypothyroidism   . Polycystic ovary   . Chickenpox   . Uterine fibroid     Review of systems:      Physical Exam    Heart and lungs: Regular rate and rhythm without rub or gallop lungs are bilaterally clear    HEENT: Normocephalic atraumatic eyes are anicteric    Other:     Pertinant exam for procedure: Soft nontender nondistended bowel sounds positive normoactive    Planned proceedures: EGD and indicated procedures I have discussed the risks benefits and complications of procedures to include not limited to bleeding, infection, perforation and the risk of sedation and the patient wishes to proceed.    Teresa Sails, MD Gastroenterology 02/24/2015  11:17 AM

## 2015-02-24 NOTE — Anesthesia Preprocedure Evaluation (Signed)
Anesthesia Evaluation  Patient identified by MRN, date of birth, ID band Patient awake    Reviewed: Allergy & Precautions, NPO status , Patient's Chart, lab work & pertinent test results  Airway Mallampati: II  TM Distance: >3 FB Neck ROM: Full    Dental  (+) Implants L upper implants:   Pulmonary former smoker,  breath sounds clear to auscultation  Pulmonary exam normal       Cardiovascular negative cardio ROS Normal cardiovascular exam    Neuro/Psych Anxiety negative neurological ROS     GI/Hepatic negative GI ROS, Neg liver ROS, GERD-  Medicated and Controlled,  Endo/Other  Hypothyroidism   Renal/GU negative Renal ROS  negative genitourinary   Musculoskeletal negative musculoskeletal ROS (+)   Abdominal Normal abdominal exam  (+)   Peds negative pediatric ROS (+)  Hematology  (+) anemia ,   Anesthesia Other Findings   Reproductive/Obstetrics                             Anesthesia Physical Anesthesia Plan  ASA: II  Anesthesia Plan: General   Post-op Pain Management:    Induction: Intravenous  Airway Management Planned: Nasal Cannula  Additional Equipment:   Intra-op Plan:   Post-operative Plan:   Informed Consent: I have reviewed the patients History and Physical, chart, labs and discussed the procedure including the risks, benefits and alternatives for the proposed anesthesia with the patient or authorized representative who has indicated his/her understanding and acceptance.   Dental advisory given  Plan Discussed with: CRNA and Surgeon  Anesthesia Plan Comments:         Anesthesia Quick Evaluation

## 2015-02-24 NOTE — Anesthesia Postprocedure Evaluation (Signed)
  Anesthesia Post-op Note  Patient: Teresa Short  Procedure(s) Performed: Procedure(s): ESOPHAGOGASTRODUODENOSCOPY (EGD) (N/A)  Anesthesia type:General  Patient location: PACU  Post pain: Pain level controlled  Post assessment: Post-op Vital signs reviewed, Patient's Cardiovascular Status Stable, Respiratory Function Stable, Patent Airway and No signs of Nausea or vomiting  Post vital signs: Reviewed and stable  Last Vitals:  Filed Vitals:   02/24/15 1140  BP: 98/65  Pulse: 72  Temp: 35.7 C  Resp: 14    Level of consciousness: awake, alert  and patient cooperative  Complications: No apparent anesthesia complications

## 2015-02-24 NOTE — Transfer of Care (Signed)
Immediate Anesthesia Transfer of Care Note  Patient: Teresa Short  Procedure(s) Performed: Procedure(s): ESOPHAGOGASTRODUODENOSCOPY (EGD) (N/A)  Patient Location: PACU  Anesthesia Type:General  Level of Consciousness: awake and sedated  Airway & Oxygen Therapy: Patient Spontanous Breathing and Patient connected to nasal cannula oxygen  Post-op Assessment: Report given to RN and Post -op Vital signs reviewed and stable  Post vital signs: Reviewed and stable  Last Vitals:  Filed Vitals:   02/24/15 0931  BP: 124/75  Pulse: 75  Temp: 36.1 C  Resp: 18    Complications: No apparent anesthesia complications

## 2015-02-24 NOTE — Anesthesia Procedure Notes (Signed)
Performed by: COOK-MARTIN, Neko Mcgeehan Pre-anesthesia Checklist: Patient identified, Emergency Drugs available, Suction available, Patient being monitored and Timeout performed Patient Re-evaluated:Patient Re-evaluated prior to inductionOxygen Delivery Method: Nasal cannula Preoxygenation: Pre-oxygenation with 100% oxygen Intubation Type: IV induction Airway Equipment and Method: Bite block Placement Confirmation: positive ETCO2 and CO2 detector     

## 2015-02-25 ENCOUNTER — Encounter: Payer: Self-pay | Admitting: Gastroenterology

## 2015-02-25 LAB — SURGICAL PATHOLOGY

## 2015-03-31 ENCOUNTER — Other Ambulatory Visit: Payer: Self-pay | Admitting: Specialist

## 2015-03-31 DIAGNOSIS — M7711 Lateral epicondylitis, right elbow: Secondary | ICD-10-CM

## 2015-04-07 ENCOUNTER — Ambulatory Visit
Admission: RE | Admit: 2015-04-07 | Discharge: 2015-04-07 | Disposition: A | Discharge status: Home / Self Care | Payer: BLUE CROSS/BLUE SHIELD | Source: Ambulatory Visit | Attending: Specialist | Admitting: Specialist

## 2015-04-07 DIAGNOSIS — M7711 Lateral epicondylitis, right elbow: Secondary | ICD-10-CM | POA: Diagnosis present

## 2015-05-13 ENCOUNTER — Encounter: Payer: Self-pay | Admitting: *Deleted

## 2015-05-13 ENCOUNTER — Other Ambulatory Visit: Payer: BLUE CROSS/BLUE SHIELD

## 2015-05-13 NOTE — Patient Instructions (Signed)
  Your procedure is scheduled on: 05/22/15 Report to Day Surgery.[facility]OND FLOOR To find out your arrival time please call [phone] between 1PM - 3PM on 05/21/15 Remember: Instructions that are not followed completely may result in serious medical risk, up to and including death, or upon the discretion of your surgeon and anesthesiologist your surgery may need to be rescheduled.    __X__ 1. Do not eat food or drink liquids after midnight. No gum chewing or hard candies.     __X__ 2. No Alcohol for 24 hours before or after surgery.   ____ 3. Bring all medications with you on the day of surgery if instructed.    __X_ 4. Notify your doctor if there is any change in your medical condition     (cold, fever, infections).     Do not wear jewelry, make-up, hairpins, clips or nail polish.  Do not wear lotions, powders, or perfumes. You may wear deodorant.  Do not shave 48 hours prior to surgery. Men may shave face and neck.  Do not bring valuables to the hospital.    [facility] is not responsible for any belongings or valuables.               Contacts, dentures or bridgework may not be worn into surgery.  Leave your suitcase in the car. After surgery it may be brought to your room.  For patients admitted to the hospital, discharge time is determined by your                treatment team.   Patients discharged the day of surgery will not be allowed to drive home.   Please read over the following fact sheets that you were given:   Surgical Site Infection Prevention   ____ Take these medicines the morning of surgery with A SIP OF WATER:    1. LEVOTHYROXINE  2. CELEXA  3. PANTOPRAZOLE  4.  5.  6.  ____ Fleet Enema (as directed)   ____ Use CHG Soap as directed  ____ Use inhalers on the day of surgery  ____ Stop metformin 2 days prior to surgery    ____ Take 1/2 of usual insulin dose the night before surgery and none on the morning of surgery.   ____ Stop  Coumadin/Plavix/aspirin on   ____ Stop Anti-inflammatories on    __X__ Stop supplements until after surgery.    ____ Bring C-Pap to the hospital.

## 2015-05-13 NOTE — OR Nursing (Signed)
[facility] CALLED TO [facility] IN OR

## 2015-05-14 NOTE — OR Nursing (Signed)
Patient called back medicine doses and instructed to take Losartan am surgery

## 2015-05-22 ENCOUNTER — Encounter
Admission: RE | Disposition: A | Discharge status: Home / Self Care | Payer: Self-pay | Source: Ambulatory Visit | Attending: Specialist

## 2015-05-22 ENCOUNTER — Ambulatory Visit: Payer: BLUE CROSS/BLUE SHIELD | Admitting: Anesthesiology

## 2015-05-22 ENCOUNTER — Encounter: Payer: Self-pay | Admitting: *Deleted

## 2015-05-22 ENCOUNTER — Ambulatory Visit
Admission: RE | Admit: 2015-05-22 | Discharge: 2015-05-22 | Disposition: A | Discharge status: Home / Self Care | Payer: BLUE CROSS/BLUE SHIELD | Source: Ambulatory Visit | Attending: Specialist | Admitting: Specialist

## 2015-05-22 DIAGNOSIS — K2970 Gastritis, unspecified, without bleeding: Secondary | ICD-10-CM | POA: Insufficient documentation

## 2015-05-22 DIAGNOSIS — M7711 Lateral epicondylitis, right elbow: Secondary | ICD-10-CM | POA: Diagnosis not present

## 2015-05-22 DIAGNOSIS — Z85528 Personal history of other malignant neoplasm of kidney: Secondary | ICD-10-CM | POA: Diagnosis not present

## 2015-05-22 DIAGNOSIS — Z6836 Body mass index (BMI) 36.0-36.9, adult: Secondary | ICD-10-CM | POA: Diagnosis not present

## 2015-05-22 DIAGNOSIS — Z79899 Other long term (current) drug therapy: Secondary | ICD-10-CM | POA: Diagnosis not present

## 2015-05-22 DIAGNOSIS — Z87891 Personal history of nicotine dependence: Secondary | ICD-10-CM | POA: Diagnosis not present

## 2015-05-22 DIAGNOSIS — I10 Essential (primary) hypertension: Secondary | ICD-10-CM | POA: Diagnosis not present

## 2015-05-22 HISTORY — DX: Gastritis, unspecified, without bleeding: K29.70

## 2015-05-22 HISTORY — PX: FLEXOR TENOTOMY: SHX6342

## 2015-05-22 SURGERY — TENOTOMY, FLEXOR
Anesthesia: General | Laterality: Right | Wound class: Clean

## 2015-05-22 MED ORDER — MIDAZOLAM HCL 5 MG/5ML IJ SOLN
INTRAMUSCULAR | Status: AC
  Administered 2015-05-22: 1 mg via INTRAVENOUS
  Filled 2015-05-22: qty 5

## 2015-05-22 MED ORDER — PROMETHAZINE HCL 25 MG/ML IJ SOLN: 6.2500 mg | INTRAMUSCULAR | Status: DC | PRN

## 2015-05-22 MED ORDER — FENTANYL CITRATE (PF) 100 MCG/2ML IJ SOLN: 25.0000 ug | INTRAMUSCULAR | Status: DC | PRN

## 2015-05-22 MED ORDER — ONDANSETRON HCL 4 MG/2ML IJ SOLN: 4.0000 mg | Freq: Once | INTRAMUSCULAR | Status: DC | PRN

## 2015-05-22 MED ORDER — MIDAZOLAM HCL 5 MG/5ML IJ SOLN
1.0000 mg | Freq: Once | INTRAMUSCULAR | Status: AC
  Administered 2015-05-22: 1 mg via INTRAVENOUS

## 2015-05-22 MED ORDER — FENTANYL CITRATE (PF) 100 MCG/2ML IJ SOLN
INTRAMUSCULAR | Status: AC
  Administered 2015-05-22: 50 ug via INTRAVENOUS
  Filled 2015-05-22: qty 2

## 2015-05-22 MED ORDER — BUPIVACAINE HCL 0.5 % IJ SOLN
INTRAMUSCULAR | Status: DC | PRN
  Administered 2015-05-22: 15 mL

## 2015-05-22 MED ORDER — BUPIVACAINE HCL (PF) 0.5 % IJ SOLN
INTRAMUSCULAR | Status: AC
  Filled 2015-05-22: qty 30

## 2015-05-22 MED ORDER — LIDOCAINE HCL (CARDIAC) 20 MG/ML IV SOLN
INTRAVENOUS | Status: DC | PRN
  Administered 2015-05-22: 100 mg via INTRAVENOUS

## 2015-05-22 MED ORDER — FENTANYL CITRATE (PF) 100 MCG/2ML IJ SOLN
INTRAMUSCULAR | Status: DC | PRN
  Administered 2015-05-22: 150 ug via INTRAVENOUS
  Administered 2015-05-22: 50 ug via INTRAVENOUS

## 2015-05-22 MED ORDER — DEXAMETHASONE SODIUM PHOSPHATE 4 MG/ML IJ SOLN
INTRAMUSCULAR | Status: DC | PRN
  Administered 2015-05-22: 10 mg via INTRAVENOUS

## 2015-05-22 MED ORDER — PROPOFOL 10 MG/ML IV BOLUS
INTRAVENOUS | Status: DC | PRN
  Administered 2015-05-22: 200 mg via INTRAVENOUS

## 2015-05-22 MED ORDER — ONDANSETRON HCL 4 MG/2ML IJ SOLN
INTRAMUSCULAR | Status: DC | PRN
  Administered 2015-05-22: 4 mg via INTRAVENOUS

## 2015-05-22 MED ORDER — ACETAMINOPHEN 10 MG/ML IV SOLN
INTRAVENOUS | Status: DC | PRN
  Administered 2015-05-22: 1000 mg via INTRAVENOUS

## 2015-05-22 MED ORDER — ROPIVACAINE HCL 5 MG/ML IJ SOLN
INTRAMUSCULAR | Status: AC
  Administered 2015-05-22: 30 mL via EPIDURAL
  Filled 2015-05-22: qty 40

## 2015-05-22 MED ORDER — DEXTROSE 5 % IV SOLN: 2000.0000 mg | Freq: Once | INTRAVENOUS | Status: DC

## 2015-05-22 MED ORDER — MIDAZOLAM HCL 2 MG/2ML IJ SOLN
INTRAMUSCULAR | Status: DC | PRN
  Administered 2015-05-22: 2 mg via INTRAVENOUS

## 2015-05-22 MED ORDER — FENTANYL CITRATE (PF) 100 MCG/2ML IJ SOLN
25.0000 ug | INTRAMUSCULAR | Status: DC | PRN
  Administered 2015-05-22 (×2): 50 ug via INTRAVENOUS

## 2015-05-22 MED ORDER — LIDOCAINE HCL (PF) 1 % IJ SOLN
INTRAMUSCULAR | Status: AC
  Administered 2015-05-22: 5 mL
  Filled 2015-05-22: qty 5

## 2015-05-22 MED ORDER — ACETAMINOPHEN 10 MG/ML IV SOLN
INTRAVENOUS | Status: AC
  Filled 2015-05-22: qty 100

## 2015-05-22 MED ORDER — CEFAZOLIN SODIUM-DEXTROSE 2-3 GM-% IV SOLR: 2.0000 g | Freq: Once | INTRAVENOUS | Status: DC

## 2015-05-22 MED ORDER — CEFAZOLIN SODIUM-DEXTROSE 2-3 GM-% IV SOLR
INTRAVENOUS | Status: AC
  Administered 2015-05-22: 2 g via INTRAVENOUS
  Filled 2015-05-22: qty 50

## 2015-05-22 MED ORDER — KETOROLAC TROMETHAMINE 30 MG/ML IJ SOLN
INTRAMUSCULAR | Status: DC | PRN
  Administered 2015-05-22: 30 mg via INTRAVENOUS

## 2015-05-22 MED ORDER — OXYCODONE-ACETAMINOPHEN 10-325 MG PO TABS: 1.0000 | ORAL_TABLET | ORAL | Status: DC | PRN

## 2015-05-22 MED ORDER — LACTATED RINGERS IV SOLN
INTRAVENOUS | Status: DC
  Administered 2015-05-22: 08:00:00 via INTRAVENOUS
  Administered 2015-05-22: 50 mL/h via INTRAVENOUS
  Administered 2015-05-22: 07:00:00 via INTRAVENOUS

## 2015-05-22 SURGICAL SUPPLY — 42 items
BANDAGE ELASTIC 4 CLIP NS LF (GAUZE/BANDAGES/DRESSINGS) ×3 IMPLANT
BANDAGE ELASTIC 4 CLIP ST LF (GAUZE/BANDAGES/DRESSINGS) ×3 IMPLANT
BNDG COHESIVE 4X5 TAN STRL (GAUZE/BANDAGES/DRESSINGS) ×3 IMPLANT
BNDG ESMARK 4X12 TAN STRL LF (GAUZE/BANDAGES/DRESSINGS) ×3 IMPLANT
CANISTER SUCT 1200ML W/VALVE (MISCELLANEOUS) ×3 IMPLANT
CHLORAPREP W/TINT 26ML (MISCELLANEOUS) ×3 IMPLANT
CLOSURE WOUND 1/2 X4 (GAUZE/BANDAGES/DRESSINGS)
CUFF TOURN SGL QUICK 18 (TOURNIQUET CUFF) IMPLANT
GAUZE PETRO XEROFOAM 1X8 (MISCELLANEOUS) ×3 IMPLANT
GAUZE SPONGE 4X4 12PLY STRL (GAUZE/BANDAGES/DRESSINGS) ×3 IMPLANT
GLOVE BIO SURGEON STRL SZ7.5 (GLOVE) ×3 IMPLANT
GOWN STRL REUS W/ TWL LRG LVL3 (GOWN DISPOSABLE) ×2 IMPLANT
GOWN STRL REUS W/TWL LRG LVL3 (GOWN DISPOSABLE) ×4
KIT RM TURNOVER STRD PROC AR (KITS) ×3 IMPLANT
LOOP VESSEL SUPERMAXI WHITE (MISCELLANEOUS) ×3 IMPLANT
NDL MAYO CATGUT SZ5 (NEEDLE) ×2
NDL SUT 5 .5 CRC TROC PNT MAYO (NEEDLE) ×1 IMPLANT
NS IRRIG 1000ML POUR BTL (IV SOLUTION) ×3 IMPLANT
PACK EXTREMITY ARMC (MISCELLANEOUS) ×3 IMPLANT
PAD CAST CTTN 4X4 STRL (SOFTGOODS) ×1 IMPLANT
PAD GROUND ADULT SPLIT (MISCELLANEOUS) ×3 IMPLANT
PADDING CAST COTTON 4X4 STRL (SOFTGOODS) ×2
PASSER SUT SWANSON 36MM LOOP (INSTRUMENTS) ×3 IMPLANT
SLING ARM LRG DEEP (SOFTGOODS) ×3 IMPLANT
SLING ARM M TX990204 (SOFTGOODS) ×3 IMPLANT
SPLINT CAST 1 STEP 4X30 (MISCELLANEOUS) ×3 IMPLANT
SPONGE LAP 18X18 5 PK (GAUZE/BANDAGES/DRESSINGS) ×3 IMPLANT
STAPLER SKIN PROX 35W (STAPLE) ×3 IMPLANT
STOCKINETTE IMPERVIOUS 9X36 MD (GAUZE/BANDAGES/DRESSINGS) ×3 IMPLANT
STRIP CLOSURE SKIN 1/2X4 (GAUZE/BANDAGES/DRESSINGS) IMPLANT
SUT ETHIBOND 2-0 (SUTURE) ×3 IMPLANT
SUT ETHIBOND 3-0  EXTR (SUTURE) ×2
SUT ETHIBOND 3-0 EXTR (SUTURE) ×1 IMPLANT
SUT ETHILON 4-0 (SUTURE) ×2
SUT ETHILON 4-0 FS2 18XMFL BLK (SUTURE) ×1
SUT PROLENE 3 0 PS 2 (SUTURE) IMPLANT
SUT VIC AB 3-0 SH 27 (SUTURE) ×2
SUT VIC AB 3-0 SH 27X BRD (SUTURE) ×1 IMPLANT
SUTURE ETHLN 4-0 FS2 18XMF BLK (SUTURE) ×1 IMPLANT
WIRE Z .035 C-WIRE SPADE TIP (WIRE) IMPLANT
WIRE Z .045 C-WIRE SPADE TIP (WIRE) IMPLANT
WIRE Z .062 C-WIRE SPADE TIP (WIRE) IMPLANT

## 2015-05-22 NOTE — Brief Op Note (Signed)
05/22/2015  9:21 AM  PATIENT:  Teresa Short  49 y.o. female  PRE-OPERATIVE DIAGNOSIS:  LATERAL EPICONDYLITIS right elbow  POST-OPERATIVE DIAGNOSIS:  lateral epicondylitis right elbow  PROCEDURE:  Procedure(s): right elbow extensor origin repair (Right)  SURGEON:  Surgeon(s) and Role:    * Christophe Louis, MD - Primary  PHYSICIAN ASSISTANT:   ASSISTANTS: none   ANESTHESIA:   general  EBL:  Total I/O In: 800 [I.V.:800] Out: -   BLOOD ADMINISTERED:none  DRAINS: none   LOCAL MEDICATIONS USED:  MARCAINE     SPECIMEN:  No Specimen  DISPOSITION OF SPECIMEN:  N/A  COUNTS:  YES  TOURNIQUET:    DICTATION: .Other Dictation: Dictation Number [id number]  PLAN OF CARE: Discharge to home after PACU  PATIENT DISPOSITION:  PACU - hemodynamically stable.   Delay start of Pharmacological VTE agent (>24hrs) due to surgical blood loss or risk of bleeding: not applicable

## 2015-05-22 NOTE — H&P (Signed)
  49 year old with recalcitrant lateral epicondylitis right elbow.  History and physical exam has been inserted into the chart in the form of a paper document.  Heart and lungs clear.  ENT normal.  Plan: lateral repair right elbow

## 2015-05-22 NOTE — Progress Notes (Signed)
Pt has small (approx nickel size) reddened dry area, right elbow "up against a brick wall" - Dr Tamala Julian aware of same when in to see patient.

## 2015-05-22 NOTE — Anesthesia Preprocedure Evaluation (Addendum)
Anesthesia Evaluation  Patient identified by MRN, date of birth, ID band Patient awake    Reviewed: Allergy & Precautions, H&P , NPO status , Patient's Chart, lab work & pertinent test results, reviewed documented beta blocker date and time   History of Anesthesia Complications Negative for: history of anesthetic complications  Airway Mallampati: III  TM Distance: >3 FB Neck ROM: full    Dental no notable dental hx. (+) Teeth Intact   Pulmonary neg shortness of breath, neg sleep apnea, neg COPD, neg recent URI, former smoker,    Pulmonary exam normal breath sounds clear to auscultation       Cardiovascular Exercise Tolerance: Good hypertension, On Medications Normal cardiovascular exam Rhythm:regular Rate:Normal     Neuro/Psych negative neurological ROS  negative psych ROS   GI/Hepatic Neg liver ROS, Erosive gastritis   Endo/Other  neg diabetesHypothyroidism Morbid obesity  Renal/GU Renal disease (history of kidney cancer)  negative genitourinary   Musculoskeletal   Abdominal   Peds  Hematology negative hematology ROS (+)   Anesthesia Other Findings Past Medical History:   Anemia                                                       Cancer                                                       Hypothyroidism                                               Polycystic ovary                                             Chickenpox                                                   Uterine fibroid                                              Gastritis                                                      Comment:EROSIVE   Reproductive/Obstetrics negative OB ROS                            Anesthesia Physical Anesthesia Plan  ASA: III  Anesthesia Plan: General   Post-op Pain Management: MAC Combined w/ Regional for Post-op pain  Induction:   Airway Management Planned:   Additional  Equipment:   Intra-op Plan:   Post-operative Plan:   Informed Consent: I have reviewed the patients History and Physical, chart, labs and discussed the procedure including the risks, benefits and alternatives for the proposed anesthesia with the patient or authorized representative who has indicated his/her understanding and acceptance.   Dental Advisory Given  Plan Discussed with: Anesthesiologist, CRNA and Surgeon  Anesthesia Plan Comments: (Patient was consented pre-operatively for a post-op regional block if she was having significant pain.)       Anesthesia Quick Evaluation

## 2015-05-22 NOTE — Anesthesia Procedure Notes (Addendum)
Procedure Name: LMA Insertion Date/Time: 05/22/2015 7:45 AM Performed by: Nelda Marseille Pre-anesthesia Checklist: Patient identified, Emergency Drugs available, Suction available, Patient being monitored and Timeout performed Patient Re-evaluated:Patient Re-evaluated prior to inductionOxygen Delivery Method: Circle system utilized Preoxygenation: Pre-oxygenation with 100% oxygen Intubation Type: IV induction Ventilation: Mask ventilation without difficulty LMA: LMA inserted LMA Size: 3.5 Number of attempts: 1 Placement Confirmation: positive ETCO2 and breath sounds checked- equal and bilateral Tube secured with: Tape Dental Injury: Teeth and Oropharynx as per pre-operative assessment    Anesthesia Regional Block:  Supraclavicular block  Pre-Anesthetic Checklist: ,, timeout performed, Correct Patient, Correct Site, Correct Laterality, Correct Procedure, Correct Position, site marked, Risks and benefits discussed,  Surgical consent,  Pre-op evaluation,  At surgeon's request and post-op pain management  Laterality: Right and Upper  Prep: chloraprep       Needles:  Injection technique: Single-shot  Needle Type: Echogenic Stimulator Needle     Needle Length: 10cm 10 cm Needle Gauge: 22 and 22 G    Additional Needles:  Procedures: ultrasound guided (picture in chart) Supraclavicular block Narrative:  Start time: 05/22/2015 10:10 AM End time: 05/22/2015 10:15 AM Injection made incrementally with aspirations every 5 mL.  Performed by: Personally  Anesthesiologist: Martha Clan

## 2015-05-22 NOTE — Transfer of Care (Signed)
Immediate Anesthesia Transfer of Care Note  Patient: Teresa Short  Procedure(s) Performed: Procedure(s): right elbow extensor origin repair (Right)  Patient Location: PACU  Anesthesia Type:General  Level of Consciousness: sedated  Airway & Oxygen Therapy: Patient Spontanous Breathing and Patient connected to face mask oxygen  Post-op Assessment: Report given to RN and Post -op Vital signs reviewed and stable  Post vital signs: Reviewed and stable  Last Vitals:  Filed Vitals:   05/22/15 0915  BP: 133/84  Pulse: 86  Temp: 37 C  Resp: 15    Complications: No apparent anesthesia complications

## 2015-05-22 NOTE — Discharge Instructions (Addendum)
AMBULATORY SURGERY  DISCHARGE INSTRUCTIONS   1) The drugs that you were given will stay in your system until tomorrow so for the next 24 hours you should not:  A) Drive an automobile B) Make any legal decisions C) Drink any alcoholic beverage   2) You may resume regular meals tomorrow.  Today it is better to start with liquids and gradually work up to solid foods.  You may eat anything you prefer, but it is better to start with liquids, then soup and crackers, and gradually work up to solid foods.   3) Please notify your doctor immediately if you have any unusual bleeding, trouble breathing, redness and pain at the surgery site, drainage, fever, or pain not relieved by medication.    4) Additional Instructions:  Keep bandage clean and dry       Wear sling, may remove as necessary      Please contact your physician with any problems or Same Day Surgery at [phone], Monday through Friday 6 am to 4 pm, or [place] at [facility] number at [phone].

## 2015-05-23 NOTE — Op Note (Signed)
Teresa Short, Teresa Short                ACCOUNT NO.:  [id number]  MEDICAL RECORD NO.:  [id number]  LOCATION:  [facility]                         FACILITY:  [facility]  PHYSICIAN:  Margaretmary Eddy, MD        DATE OF BIRTH:  06-30-1966  DATE OF PROCEDURE:  05/22/2015 DATE OF DISCHARGE:  05/22/2015                              OPERATIVE REPORT   PREOPERATIVE DIAGNOSIS:  Persistent lateral epicondylitis, right elbow.  POSTOPERATIVE DIAGNOSIS:  Persistent lateral epicondylitis, right elbow.  PROCEDURE PERFORMED:  Tennis elbow repair, right elbow.  SURGEON:  Margaretmary Eddy, MD  SURGEON:  Christophe Louis, MD.  ANESTHESIA:  General.  TOURNIQUET TIME:  60 minutes.  DRAINS:  None.  DESCRIPTION OF PROCEDURE:  A 2 g of Ancef were given intravenously prior to the procedure.  General anesthesia was induced.  The right upper extremity was thoroughly prepped with alcohol and ChloraPrep and draped in standard sterile fashion.  The extremity was wrapped out with the Esmarch bandage, and pneumatic tourniquet elevated to 250 mmHg.  A standard curved incision was then made over the lateral epicondyle. Dissection was carefully carried down to the extensor fascia overlying the extensor origin.  This is incised longitudinally and reflected backward.  Starting at the lateral epicondyle, the central portion of the extensor mechanism was then split longitudinally and subperiosteally was elevated both anteriorly and inferiorly.  This was carried down to the radiocapitellar joint which was seen to be normal.  The dissection demonstrated a large amount of scar tissue within the extensor origin attachment, both inferiorly and anteriorly.  Using the knife and under loupe magnification, all areas of granulation tissue were removed.  The lateral epicondyle was then decorticated down to cancellous bone.  One 8- inch drill was then used to create a drill hole for reattachment of the extensor.  A #2 Ethibond suture was then used  in the extensor mechanism inferiorly.  The suture was then passed through the lateral epicondyle and then up into the anterior extensor mechanism.  The extensor mechanism was then pulled down and secured securely against the decorticated lateral epicondyle.  The longitudinal portion of the incision and the mechanism was also repaired with #2 Ethibond.  The wound was thoroughly irrigated multiple times.  Skin edges were infiltrated with 0.5% plain Marcaine.  Subcutaneous tissue was closed with 3-0 Vicryl.  Skin was closed with the skin stapler.  Soft bulky dressing was applied, and the tourniquet was released.  The patient was returned to the recovery room in satisfactory condition, having tolerated the procedure quite well.          ______________________________ Margaretmary Eddy, MD     CS/MEDQ  D:  05/23/2015  T:  05/23/2015  Job:  [id number]

## 2015-05-28 NOTE — Anesthesia Postprocedure Evaluation (Signed)
  Anesthesia Post-op Note  Patient: Teresa Short  Procedure(s) Performed: Procedure(s): right elbow extensor origin repair (Right)  Anesthesia type:General  Patient location: PACU  Post pain: Pain level controlled  Post assessment: Post-op Vital signs reviewed, Patient's Cardiovascular Status Stable, Respiratory Function Stable, Patent Airway and No signs of Nausea or vomiting  Post vital signs: Reviewed and stable  Last Vitals:  Filed Vitals:   05/22/15 1120  BP: 130/79  Pulse: 80  Temp:   Resp: 16    Level of consciousness: awake, alert  and patient cooperative  Complications: No apparent anesthesia complications

## 2015-09-16 ENCOUNTER — Encounter (HOSPITAL_COMMUNITY): Payer: Self-pay

## 2015-09-16 ENCOUNTER — Emergency Department (HOSPITAL_COMMUNITY): Payer: BLUE CROSS/BLUE SHIELD

## 2015-09-16 ENCOUNTER — Emergency Department (HOSPITAL_COMMUNITY)
Admission: EM | Admit: 2015-09-16 | Discharge: 2015-09-17 | Disposition: A | Discharge status: Home / Self Care | Payer: BLUE CROSS/BLUE SHIELD | Attending: Emergency Medicine | Admitting: Emergency Medicine

## 2015-09-16 DIAGNOSIS — Y998 Other external cause status: Secondary | ICD-10-CM | POA: Diagnosis not present

## 2015-09-16 DIAGNOSIS — Z86018 Personal history of other benign neoplasm: Secondary | ICD-10-CM | POA: Diagnosis not present

## 2015-09-16 DIAGNOSIS — Z91040 Latex allergy status: Secondary | ICD-10-CM | POA: Insufficient documentation

## 2015-09-16 DIAGNOSIS — Z8619 Personal history of other infectious and parasitic diseases: Secondary | ICD-10-CM | POA: Diagnosis not present

## 2015-09-16 DIAGNOSIS — Y9389 Activity, other specified: Secondary | ICD-10-CM | POA: Diagnosis not present

## 2015-09-16 DIAGNOSIS — Z859 Personal history of malignant neoplasm, unspecified: Secondary | ICD-10-CM | POA: Diagnosis not present

## 2015-09-16 DIAGNOSIS — Z3202 Encounter for pregnancy test, result negative: Secondary | ICD-10-CM | POA: Insufficient documentation

## 2015-09-16 DIAGNOSIS — Z862 Personal history of diseases of the blood and blood-forming organs and certain disorders involving the immune mechanism: Secondary | ICD-10-CM | POA: Diagnosis not present

## 2015-09-16 DIAGNOSIS — Z87891 Personal history of nicotine dependence: Secondary | ICD-10-CM | POA: Diagnosis not present

## 2015-09-16 DIAGNOSIS — E039 Hypothyroidism, unspecified: Secondary | ICD-10-CM | POA: Diagnosis not present

## 2015-09-16 DIAGNOSIS — Z79899 Other long term (current) drug therapy: Secondary | ICD-10-CM | POA: Insufficient documentation

## 2015-09-16 DIAGNOSIS — Y92410 Unspecified street and highway as the place of occurrence of the external cause: Secondary | ICD-10-CM | POA: Insufficient documentation

## 2015-09-16 DIAGNOSIS — S30811A Abrasion of abdominal wall, initial encounter: Secondary | ICD-10-CM | POA: Diagnosis not present

## 2015-09-16 DIAGNOSIS — Z8719 Personal history of other diseases of the digestive system: Secondary | ICD-10-CM | POA: Insufficient documentation

## 2015-09-16 DIAGNOSIS — S3991XA Unspecified injury of abdomen, initial encounter: Secondary | ICD-10-CM | POA: Diagnosis present

## 2015-09-16 LAB — CBC
HEMATOCRIT: 37.5 % (ref 36.0–46.0)
HEMOGLOBIN: 12.6 g/dL (ref 12.0–15.0)
MCH: 30 pg (ref 26.0–34.0)
MCHC: 33.6 g/dL (ref 30.0–36.0)
MCV: 89.3 fL (ref 78.0–100.0)
Platelets: 283 10*3/uL (ref 150–400)
RBC: 4.2 MIL/uL (ref 3.87–5.11)
RDW: 13.5 % (ref 11.5–15.5)
WBC: 14 10*3/uL — ABNORMAL HIGH (ref 4.0–10.5)

## 2015-09-16 LAB — I-STAT BETA HCG BLOOD, ED (MC, WL, AP ONLY): I-stat hCG, quantitative: <5 m[IU]/mL (ref <5)

## 2015-09-16 MED ORDER — OXYCODONE-ACETAMINOPHEN 5-325 MG PO TABS
1.0000 | ORAL_TABLET | Freq: Once | ORAL | Status: AC
  Administered 2015-09-16: 1 via ORAL
  Filled 2015-09-16: qty 1

## 2015-09-16 MED ORDER — HYDROMORPHONE HCL 1 MG/ML IJ SOLN
1.0000 mg | Freq: Once | INTRAMUSCULAR | Status: AC
  Administered 2015-09-17: 1 mg via INTRAVENOUS
  Filled 2015-09-16: qty 1
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
## 2015-09-16 NOTE — ED Notes (Signed)
Nurse drawing labs. 
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
## 2015-09-17 ENCOUNTER — Emergency Department (HOSPITAL_COMMUNITY): Payer: BLUE CROSS/BLUE SHIELD

## 2015-09-17 ENCOUNTER — Encounter (HOSPITAL_COMMUNITY): Payer: Self-pay

## 2015-09-17 DIAGNOSIS — S30811A Abrasion of abdominal wall, initial encounter: Secondary | ICD-10-CM | POA: Diagnosis not present

## 2015-09-17 LAB — BASIC METABOLIC PANEL
ANION GAP: 10 (ref 5–15)
BUN: 12 mg/dL (ref 6–20)
CO2: 24 mmol/L (ref 22–32)
Calcium: 9.7 mg/dL (ref 8.9–10.3)
Chloride: 106 mmol/L (ref 101–111)
Creatinine, Ser: 0.95 mg/dL (ref 0.44–1.00)
GLUCOSE: 104 mg/dL — AB (ref 65–99)
POTASSIUM: 3.8 mmol/L (ref 3.5–5.1)
SODIUM: 140 mmol/L (ref 135–145)

## 2015-09-17 MED ORDER — IOHEXOL 300 MG/ML  SOLN
100.0000 mL | Freq: Once | INTRAMUSCULAR | Status: AC | PRN
  Administered 2015-09-17: 100 mL via INTRAVENOUS

## 2015-09-17 MED ORDER — NAPROXEN 250 MG PO TABS: 250.0000 mg | ORAL_TABLET | Freq: Two times a day (BID) | ORAL | Status: DC

## 2015-09-17 MED ORDER — OXYCODONE-ACETAMINOPHEN 5-325 MG PO TABS: 1.0000 | ORAL_TABLET | Freq: Four times a day (QID) | ORAL | Status: DC | PRN

## 2015-09-17 MED ORDER — METHOCARBAMOL 500 MG PO TABS: 500.0000 mg | ORAL_TABLET | Freq: Two times a day (BID) | ORAL | Status: DC | PRN
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
## 2016-02-01 ENCOUNTER — Other Ambulatory Visit: Payer: Self-pay | Admitting: Gastroenterology

## 2016-02-01 DIAGNOSIS — R1010 Upper abdominal pain, unspecified: Secondary | ICD-10-CM

## 2016-02-04 ENCOUNTER — Other Ambulatory Visit
Admission: RE | Admit: 2016-02-04 | Discharge: 2016-02-04 | Disposition: A | Discharge status: Home / Self Care | Payer: BLUE CROSS/BLUE SHIELD | Source: Ambulatory Visit | Attending: Gastroenterology | Admitting: Gastroenterology

## 2016-02-04 DIAGNOSIS — R1011 Right upper quadrant pain: Secondary | ICD-10-CM | POA: Diagnosis not present

## 2016-02-04 LAB — PREGNANCY, URINE: PREG TEST UR: NEGATIVE

## 2016-02-09 ENCOUNTER — Encounter
Admission: RE | Admit: 2016-02-09 | Discharge: 2016-02-09 | Disposition: A | Discharge status: Home / Self Care | Payer: BLUE CROSS/BLUE SHIELD | Source: Ambulatory Visit | Attending: Gastroenterology | Admitting: Gastroenterology

## 2016-02-09 ENCOUNTER — Ambulatory Visit
Admission: RE | Admit: 2016-02-09 | Discharge: 2016-02-09 | Disposition: A | Discharge status: Home / Self Care | Payer: BLUE CROSS/BLUE SHIELD | Source: Ambulatory Visit | Attending: Gastroenterology | Admitting: Gastroenterology

## 2016-02-09 DIAGNOSIS — R1010 Upper abdominal pain, unspecified: Secondary | ICD-10-CM | POA: Insufficient documentation

## 2016-02-09 MED ORDER — TECHNETIUM TC 99M MEBROFENIN IV KIT
5.3200 | PACK | Freq: Once | INTRAVENOUS | Status: AC | PRN
  Administered 2016-02-09: 5.32 via INTRAVENOUS

## 2016-02-16 ENCOUNTER — Encounter: Payer: Self-pay | Admitting: *Deleted

## 2016-03-02 ENCOUNTER — Encounter: Payer: Self-pay | Admitting: *Deleted

## 2016-03-03 ENCOUNTER — Encounter: Payer: Self-pay | Admitting: General Surgery

## 2016-03-03 ENCOUNTER — Ambulatory Visit (INDEPENDENT_AMBULATORY_CARE_PROVIDER_SITE_OTHER): Payer: BLUE CROSS/BLUE SHIELD | Admitting: General Surgery

## 2016-03-03 VITALS — BP 162/94 | HR 80 | Resp 14 | Ht 68.0 in | Wt 276.0 lb

## 2016-03-03 DIAGNOSIS — R197 Diarrhea, unspecified: Secondary | ICD-10-CM | POA: Insufficient documentation

## 2016-03-03 DIAGNOSIS — R1011 Right upper quadrant pain: Secondary | ICD-10-CM

## 2016-03-03 NOTE — Patient Instructions (Signed)
The patient is aware to call back for any questions or concerns.  
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
## 2016-03-17 ENCOUNTER — Ambulatory Visit
Admission: RE | Admit: 2016-03-17 | Payer: No Typology Code available for payment source | Source: Ambulatory Visit | Admitting: Gastroenterology

## 2016-03-17 ENCOUNTER — Encounter: Admission: RE | Payer: Self-pay | Source: Ambulatory Visit

## 2016-03-17 SURGERY — COLONOSCOPY WITH PROPOFOL
Anesthesia: General

## 2016-04-28 DIAGNOSIS — D72829 Elevated white blood cell count, unspecified: Secondary | ICD-10-CM | POA: Insufficient documentation

## 2016-04-28 NOTE — Progress Notes (Signed)
[place]  Telephone:([phone]) [phone] Fax:(336) [phone]  ID: Teressa Senter OB: November 28, 1965  MR#: [id number]  NOM#:[id number]  Patient Care Team: Ricardo Jericho, NP as PCP - General (Family Medicine) Lollie Sails, MD as Consulting Physician (Gastroenterology) Robert Bellow, MD (General Surgery)  CHIEF COMPLAINT: Leukocytosis  INTERVAL HISTORY: Patient is a 50 year old female who was noted to have a persistently elevated white blood cell count over the past year. She currently is highly anxious, but otherwise feels well. She denies any recent fevers or illnesses. She has a good appetite and denies weight loss. She has no neurologic complaints. She denies any chest pain or shortness of breath. She has no nausea, vomiting, constipation, or diarrhea. She has no urinary complaints. Patient otherwise feels well and offers no further specific complaints.  REVIEW OF SYSTEMS:   Review of Systems  Constitutional: Negative.  Negative for fever, malaise/fatigue and weight loss.  Respiratory: Negative.  Negative for cough and shortness of breath.   Cardiovascular: Negative.  Negative for chest pain and leg swelling.  Gastrointestinal: Negative.  Negative for abdominal pain.  Genitourinary: Negative.   Musculoskeletal: Negative.   Neurological: Negative.  Negative for weakness.  Psychiatric/Behavioral: The patient is nervous/anxious.     As per HPI. Otherwise, a complete review of systems is negative.  PAST MEDICAL HISTORY: Past Medical History:  Diagnosis Date  . Anemia   . Cancer [facility]) 2015   Renal cell-tumor removed  . Chickenpox   . Gastritis    EROSIVE  . Hyperlipidemia   . Hypertension   . Hypothyroidism   . Migraine   . Polycystic ovary   . Uterine fibroid     PAST SURGICAL HISTORY: Past Surgical History:  Procedure Laterality Date  . APPENDECTOMY  June 2015   Dr Leanora Cover  . CARPAL TUNNEL RELEASE Bilateral 2012,2013  . DILATION AND  CURETTAGE OF UTERUS     hysteroscopy,ablation  . ESOPHAGOGASTRODUODENOSCOPY N/A 02/24/2015   Procedure: ESOPHAGOGASTRODUODENOSCOPY (EGD);  Surgeon: Lollie Sails, MD;  Location: [facility] ENDOSCOPY;  Service: Endoscopy;  Laterality: N/A;  . FLEXOR TENOTOMY Right 05/22/2015   Procedure: right elbow extensor origin repair;  Surgeon: Christophe Louis, MD;  Location: [facility] ORS;  Service: Orthopedics;  Laterality: Right;  . PARTIAL NEPHRECTOMY  Sept 2015   20 % at [place]  . UTERINE ABLATION      FAMILY HISTORY: Reviewed and unchanged. No reported history of malignancy or chronic disease.  ADVANCED DIRECTIVES (Y/N):  N  HEALTH MAINTENANCE: Social History  Substance Use Topics  . Smoking status: Former Smoker    Years: 25.00    Types: Cigarettes    Quit date: 02/26/2014  . Smokeless tobacco: Never Used  . Alcohol use 0.0 oz/week     Comment: socially     Colonoscopy:  PAP:  Bone density:  Lipid panel:  Allergies  Allergen Reactions  . Dm-Apap-Cpm Other (See Comments)    Unknown, childhood allergy  . Latex   . Lisinopril Cough    Current Outpatient Prescriptions  Medication Sig Dispense Refill  . Biotin (BIOTIN 5000) 5 MG CAPS Take by mouth.    . cetirizine (ZYRTEC) 10 MG tablet Take 10 mg by mouth at bedtime.     . citalopram (CELEXA) 40 MG tablet Take 40 mg by mouth daily.    Marland Kitchen levothyroxine (SYNTHROID, LEVOTHROID) 75 MCG tablet Take 75 mcg by mouth daily before breakfast.    . losartan (COZAAR) 25 MG tablet Take 25 mg by mouth daily.    Marland Kitchen  Omega-3 Fatty Acids (FISH OIL) 1000 MG CAPS Take 2 capsules by mouth every morning.    . pantoprazole (PROTONIX) 40 MG tablet Take 40 mg by mouth daily.    . sucralfate (CARAFATE) 1 g tablet Take 1 g by mouth 4 (four) times daily -  with meals and at bedtime.    . traZODone (DESYREL) 50 MG tablet Take 50 mg by mouth at bedtime.     No current facility-administered medications for this visit.     OBJECTIVE: There were no vitals filed for  this visit.   There is no height or weight on file to calculate BMI.    ECOG FS:0 - Asymptomatic  General: Well-developed, well-nourished, no acute distress. Eyes: Pink conjunctiva, anicteric sclera. HEENT: Normocephalic, moist mucous membranes, clear oropharnyx. Lungs: Clear to auscultation bilaterally. Heart: Regular rate and rhythm. No rubs, murmurs, or gallops. Abdomen: Soft, nontender, nondistended. No organomegaly noted, normoactive bowel sounds. Musculoskeletal: No edema, cyanosis, or clubbing. Neuro: Alert, answering all questions appropriately. Cranial nerves grossly intact. Skin: No rashes or petechiae noted. Psych: Normal affect. Lymphatics: No cervical, calvicular, axillary or inguinal LAD.   LAB RESULTS:  Lab Results  Component Value Date   NA 140 09/16/2015   K 3.8 09/16/2015   CL 106 09/16/2015   CO2 24 09/16/2015   GLUCOSE 104 (H) 09/16/2015   BUN 12 09/16/2015   CREATININE 0.95 09/16/2015   CALCIUM 9.7 09/16/2015   PROT 7.8 02/10/2014   ALBUMIN 4.0 02/10/2014   AST 27 02/10/2014   ALT 34 02/10/2014   ALKPHOS 99 02/10/2014   BILITOT 0.8 02/10/2014   GFRNONAA >60 09/16/2015   GFRAA >60 09/16/2015    Lab Results  Component Value Date   WBC 14.0 (H) 09/16/2015   HGB 12.6 09/16/2015   HCT 37.5 09/16/2015   MCV 89.3 09/16/2015   PLT 283 09/16/2015   Lab Results  Component Value Date   IRON 32 04/29/2016   TIBC 403 04/29/2016   IRONPCTSAT 8 (L) 04/29/2016   Lab Results  Component Value Date   FERRITIN 41 04/29/2016     STUDIES: No results found.  ASSESSMENT: Leukocytosis  PLAN:    1. Leukocytosis: Patient noted to have a mild neutrophilic predominance. Her white blood cell count remains persistently elevated today, but decreased from her previous reports. Peripheral blood flow cytometry and BCR-ABL gene mutation were ordered for completeness and are pending at time of dictation. No intervention is needed at this time. Patient does not require  bone marrow biopsy. Return to clinic in 4 weeks with repeat laboratory work and further evaluation. 2. Iron deficiency: Patient's hemoglobin is within normal limits. Recommend oral iron supplementation. 3. History of renal cell carcinoma: Patient had partial nephrectomy in 2015. Repeat abdominal MRI on February 29, 2016 did not reveal any evidence of recurrence. Continue follow-up at [facility] as previously scheduled.  Patient expressed understanding and was in agreement with this plan. She also understands that She can call clinic at any time with any questions, concerns, or complaints.    Lloyd Huger, MD   04/28/2016 10:24 PM

## 2016-04-29 ENCOUNTER — Inpatient Hospital Stay: Payer: BLUE CROSS/BLUE SHIELD | Attending: Oncology | Admitting: Oncology

## 2016-04-29 ENCOUNTER — Ambulatory Visit: Payer: BLUE CROSS/BLUE SHIELD

## 2016-04-29 ENCOUNTER — Encounter: Payer: Self-pay | Admitting: Oncology

## 2016-04-29 VITALS — BP 158/93 | HR 83 | Temp 98.1°F | Ht 68.0 in | Wt 278.3 lb

## 2016-04-29 DIAGNOSIS — F419 Anxiety disorder, unspecified: Secondary | ICD-10-CM | POA: Diagnosis not present

## 2016-04-29 DIAGNOSIS — E785 Hyperlipidemia, unspecified: Secondary | ICD-10-CM

## 2016-04-29 DIAGNOSIS — I10 Essential (primary) hypertension: Secondary | ICD-10-CM | POA: Diagnosis not present

## 2016-04-29 DIAGNOSIS — Z8553 Personal history of malignant neoplasm of renal pelvis: Secondary | ICD-10-CM | POA: Insufficient documentation

## 2016-04-29 DIAGNOSIS — D72829 Elevated white blood cell count, unspecified: Secondary | ICD-10-CM | POA: Insufficient documentation

## 2016-04-29 DIAGNOSIS — E039 Hypothyroidism, unspecified: Secondary | ICD-10-CM | POA: Insufficient documentation

## 2016-04-29 DIAGNOSIS — E611 Iron deficiency: Secondary | ICD-10-CM | POA: Diagnosis not present

## 2016-04-29 DIAGNOSIS — Z79899 Other long term (current) drug therapy: Secondary | ICD-10-CM | POA: Insufficient documentation

## 2016-04-29 LAB — CBC WITH DIFFERENTIAL/PLATELET
Basophils Absolute: 0.2 10*3/uL — ABNORMAL HIGH (ref 0–0.1)
Basophils Relative: 1 %
EOS PCT: 2 %
Eosinophils Absolute: 0.2 10*3/uL (ref 0–0.7)
HCT: 38 % (ref 35.0–47.0)
Hemoglobin: 12.9 g/dL (ref 12.0–16.0)
LYMPHS ABS: 3.7 10*3/uL — AB (ref 1.0–3.6)
LYMPHS PCT: 31 %
MCH: 30 pg (ref 26.0–34.0)
MCHC: 33.8 g/dL (ref 32.0–36.0)
MCV: 88.6 fL (ref 80.0–100.0)
MONO ABS: 0.9 10*3/uL (ref 0.2–0.9)
Monocytes Relative: 7 %
Neutro Abs: 7 10*3/uL — ABNORMAL HIGH (ref 1.4–6.5)
Neutrophils Relative %: 59 %
PLATELETS: 234 10*3/uL (ref 150–440)
RBC: 4.29 MIL/uL (ref 3.80–5.20)
RDW: 13.9 % (ref 11.5–14.5)
WBC: 11.9 10*3/uL — ABNORMAL HIGH (ref 3.6–11.0)

## 2016-04-29 LAB — IRON AND TIBC
Iron: 32 ug/dL (ref 28–170)
Saturation Ratios: 8 % — ABNORMAL LOW (ref 10.4–31.8)
TIBC: 403 ug/dL (ref 250–450)
UIBC: 371 ug/dL

## 2016-04-29 LAB — FERRITIN: FERRITIN: 41 ng/mL (ref 11–307)

## 2016-04-29 NOTE — Progress Notes (Signed)
Patient here for initial visit. States she is very nervous and has lot's of questions about her diagnosis.

## 2016-05-05 ENCOUNTER — Telehealth: Payer: Self-pay | Admitting: General Surgery

## 2016-05-05 LAB — BCR-ABL1, CML/ALL, PCR, QUANT

## 2016-05-05 LAB — COMP PANEL: LEUKEMIA/LYMPHOMA

## 2016-05-05 NOTE — Telephone Encounter (Signed)
CHRISTIAN LONDON NP CALLED FOR DR BYRNETT REGARDING PT.PLEASE CALL HER @ [phone] EITHER TODAY ,FRIDAY OR Monday./[facility]  ([place] ON DR Dwyane Luo DESK.

## 2016-05-25 NOTE — Progress Notes (Signed)
[place]  [phone]) [phone] Fax:(336) [phone]  ID: Teresa Short OB: 1965-10-15  MR#: [id number]  WOE#:[id number]  Patient Care Team: Ricardo Jericho, NP as PCP - General (Family Medicine) Lollie Sails, MD as Consulting Physician (Gastroenterology) Robert Bellow, MD (General Surgery)  CHIEF COMPLAINT: Leukocytosis, unspecified  INTERVAL HISTORY: Patient returns to clinic today for repeat laboratory work and further evaluation. She continues to be anxious, but otherwise feels well. She denies any recent fevers or illnesses. She has a good appetite and denies weight loss. She has no neurologic complaints. She denies any chest pain or shortness of breath. She has no nausea, vomiting, constipation, or diarrhea. She has no urinary complaints. Patient otherwise feels well and offers no further specific complaints.  REVIEW OF SYSTEMS:   Review of Systems  Constitutional: Negative.  Negative for fever, malaise/fatigue and weight loss.  Respiratory: Negative.  Negative for cough and shortness of breath.   Cardiovascular: Negative.  Negative for chest pain and leg swelling.  Gastrointestinal: Negative.  Negative for abdominal pain.  Genitourinary: Negative.   Musculoskeletal: Negative.   Neurological: Negative.  Negative for weakness.  Psychiatric/Behavioral: The patient is nervous/anxious.     As per HPI. Otherwise, a complete review of systems is negative.  PAST MEDICAL HISTORY: Past Medical History:  Diagnosis Date  . Anemia   . Cancer [facility]) 2015   Renal cell-tumor removed  . Chickenpox   . Gastritis    EROSIVE  . Hyperlipidemia   . Hypertension   . Hypothyroidism   . Migraine   . Polycystic ovary   . Uterine fibroid     PAST SURGICAL HISTORY: Past Surgical History:  Procedure Laterality Date  . APPENDECTOMY  June 2015   Dr Leanora Cover  . CARPAL TUNNEL RELEASE Bilateral 2012,2013  . DILATION AND CURETTAGE OF UTERUS     hysteroscopy,ablation  . ESOPHAGOGASTRODUODENOSCOPY N/A 02/24/2015   Procedure: ESOPHAGOGASTRODUODENOSCOPY (EGD);  Surgeon: Lollie Sails, MD;  Location: [facility] ENDOSCOPY;  Service: Endoscopy;  Laterality: N/A;  . FLEXOR TENOTOMY Right 05/22/2015   Procedure: right elbow extensor origin repair;  Surgeon: Christophe Louis, MD;  Location: [facility] ORS;  Service: Orthopedics;  Laterality: Right;  . PARTIAL NEPHRECTOMY  Sept 2015   20 % at [facility]  . UTERINE ABLATION      FAMILY HISTORY: Reviewed and unchanged. No reported history of malignancy or chronic disease.  ADVANCED DIRECTIVES (Y/N):  N  HEALTH MAINTENANCE: Social History  Substance Use Topics  . Smoking status: Former Smoker    Years: 25.00    Types: Cigarettes    Quit date: 02/26/2014  . Smokeless tobacco: Never Used  . Alcohol use 0.0 oz/week     Comment: socially     Colonoscopy:  PAP:  Bone density:  Lipid panel:  Allergies  Allergen Reactions  . Dm-Apap-Cpm Other (See Comments)    Unknown, childhood allergy Unknown, childhood allergy  . Latex   . Lisinopril Cough  . Other Other (See Comments)    Unknown, childhood allergy    Current Outpatient Prescriptions  Medication Sig Dispense Refill  . Biotin (BIOTIN 5000) 5 MG CAPS Take by mouth.    . Black Cohosh 540 MG CAPS Take 540 mg by mouth daily.    . cetirizine (ZYRTEC) 10 MG tablet Take 10 mg by mouth at bedtime.     . citalopram (CELEXA) 40 MG tablet Take 40 mg by mouth daily.    Marland Kitchen levothyroxine (SYNTHROID, LEVOTHROID) 75 MCG tablet Take 75 mcg  by mouth daily before breakfast.    . losartan (COZAAR) 25 MG tablet Take 25 mg by mouth daily.    . Omega-3 Fatty Acids (FISH OIL) 1000 MG CAPS Take 2 capsules by mouth every morning.    . pantoprazole (PROTONIX) 40 MG tablet Take 40 mg by mouth daily.    . sucralfate (CARAFATE) 1 g tablet Take 1 g by mouth 4 (four) times daily -  with meals and at bedtime.    . traZODone (DESYREL) 50 MG tablet Take 50 mg by mouth at  bedtime.     No current facility-administered medications for this visit.     OBJECTIVE: Vitals:   05/27/16 1532  BP: (!) 137/92  Pulse: 79  Resp: 18  Temp: 97.6 F (36.4 C)     Body mass index is 41.6 kg/m.    ECOG FS:0 - Asymptomatic  General: Well-developed, well-nourished, no acute distress. Eyes: Pink conjunctiva, anicteric sclera. Lungs: Clear to auscultation bilaterally. Heart: Regular rate and rhythm. No rubs, murmurs, or gallops. Abdomen: Soft, nontender, nondistended. No organomegaly noted, normoactive bowel sounds. Musculoskeletal: No edema, cyanosis, or clubbing. Neuro: Alert, answering all questions appropriately. Cranial nerves grossly intact. Skin: No rashes or petechiae noted. Psych: Normal affect.  LAB RESULTS:  Lab Results  Component Value Date   NA 140 09/16/2015   K 3.8 09/16/2015   CL 106 09/16/2015   CO2 24 09/16/2015   GLUCOSE 104 (H) 09/16/2015   BUN 12 09/16/2015   CREATININE 0.95 09/16/2015   CALCIUM 9.7 09/16/2015   PROT 7.8 02/10/2014   ALBUMIN 4.0 02/10/2014   AST 27 02/10/2014   ALT 34 02/10/2014   ALKPHOS 99 02/10/2014   BILITOT 0.8 02/10/2014   GFRNONAA >60 09/16/2015   GFRAA >60 09/16/2015    Lab Results  Component Value Date   WBC 12.2 (H) 05/27/2016   NEUTROABS 6.6 (H) 05/27/2016   HGB 12.7 05/27/2016   HCT 38.2 05/27/2016   MCV 87.4 05/27/2016   PLT 244 05/27/2016   Lab Results  Component Value Date   IRON 32 04/29/2016   TIBC 403 04/29/2016   IRONPCTSAT 8 (L) 04/29/2016   Lab Results  Component Value Date   FERRITIN 41 04/29/2016     STUDIES: No results found.  ASSESSMENT: Leukocytosis, unspecified  PLAN:    1. Leukocytosis, unspecified: Patient noted to have a mild neutrophilic predominance. Her white blood cell count remains persistently elevated today, but essentially unchanged. Peripheral blood flow cytometry and BCR-ABL gene mutation are negative. No intervention is needed at this time. Patient does  not require bone marrow biopsy. After discussion with the patient, it was agreed upon the no further follow-up is necessary. Please refer her back if there are any questions or concerns.  2. Iron deficiency: Patient's hemoglobin is within normal limits. Recommended oral iron supplementation. 3. History of renal cell carcinoma: Patient had partial nephrectomy in 2015. Repeat abdominal MRI on February 29, 2016 did not reveal any evidence of recurrence. Continue follow-up at [facility] as previously scheduled.  Patient expressed understanding and was in agreement with this plan. She also understands that She can call clinic at any time with any questions, concerns, or complaints.    Lloyd Huger, MD   06/01/2016 8:49 AM

## 2016-05-26 ENCOUNTER — Other Ambulatory Visit: Payer: Self-pay | Admitting: *Deleted

## 2016-05-26 DIAGNOSIS — D72829 Elevated white blood cell count, unspecified: Secondary | ICD-10-CM

## 2016-05-27 ENCOUNTER — Inpatient Hospital Stay (HOSPITAL_BASED_OUTPATIENT_CLINIC_OR_DEPARTMENT_OTHER): Payer: BLUE CROSS/BLUE SHIELD | Admitting: Oncology

## 2016-05-27 ENCOUNTER — Inpatient Hospital Stay: Payer: BLUE CROSS/BLUE SHIELD | Attending: Oncology

## 2016-05-27 VITALS — BP 137/92 | HR 79 | Temp 97.6°F | Resp 18 | Wt 273.6 lb

## 2016-05-27 DIAGNOSIS — Z79899 Other long term (current) drug therapy: Secondary | ICD-10-CM

## 2016-05-27 DIAGNOSIS — E282 Polycystic ovarian syndrome: Secondary | ICD-10-CM | POA: Diagnosis not present

## 2016-05-27 DIAGNOSIS — D72829 Elevated white blood cell count, unspecified: Secondary | ICD-10-CM | POA: Insufficient documentation

## 2016-05-27 DIAGNOSIS — Z905 Acquired absence of kidney: Secondary | ICD-10-CM | POA: Diagnosis not present

## 2016-05-27 DIAGNOSIS — E039 Hypothyroidism, unspecified: Secondary | ICD-10-CM | POA: Insufficient documentation

## 2016-05-27 DIAGNOSIS — Z85528 Personal history of other malignant neoplasm of kidney: Secondary | ICD-10-CM | POA: Diagnosis not present

## 2016-05-27 DIAGNOSIS — Z87891 Personal history of nicotine dependence: Secondary | ICD-10-CM | POA: Diagnosis not present

## 2016-05-27 DIAGNOSIS — F419 Anxiety disorder, unspecified: Secondary | ICD-10-CM | POA: Insufficient documentation

## 2016-05-27 DIAGNOSIS — E785 Hyperlipidemia, unspecified: Secondary | ICD-10-CM | POA: Diagnosis not present

## 2016-05-27 DIAGNOSIS — I10 Essential (primary) hypertension: Secondary | ICD-10-CM

## 2016-05-27 LAB — CBC WITH DIFFERENTIAL/PLATELET
BASOS ABS: 0.1 10*3/uL (ref 0–0.1)
BASOS PCT: 1 %
EOS ABS: 0.3 10*3/uL (ref 0–0.7)
Eosinophils Relative: 2 %
HEMATOCRIT: 38.2 % (ref 35.0–47.0)
HEMOGLOBIN: 12.7 g/dL (ref 12.0–16.0)
Lymphocytes Relative: 35 %
Lymphs Abs: 4.3 10*3/uL — ABNORMAL HIGH (ref 1.0–3.6)
MCH: 29.1 pg (ref 26.0–34.0)
MCHC: 33.3 g/dL (ref 32.0–36.0)
MCV: 87.4 fL (ref 80.0–100.0)
Monocytes Absolute: 0.9 10*3/uL (ref 0.2–0.9)
Monocytes Relative: 8 %
NEUTROS ABS: 6.6 10*3/uL — AB (ref 1.4–6.5)
NEUTROS PCT: 54 %
Platelets: 244 10*3/uL (ref 150–440)
RBC: 4.37 MIL/uL (ref 3.80–5.20)
RDW: 13.9 % (ref 11.5–14.5)
WBC: 12.2 10*3/uL — AB (ref 3.6–11.0)

## 2016-05-27 NOTE — Progress Notes (Signed)
States has cough today. Had labs drawn at [facility] which revealed infection. Also complains of fatigue since last appt.

## 2016-06-02 ENCOUNTER — Ambulatory Visit
Admission: RE | Admit: 2016-06-02 | Payer: BLUE CROSS/BLUE SHIELD | Source: Ambulatory Visit | Admitting: Gastroenterology

## 2016-06-02 ENCOUNTER — Encounter: Admission: RE | Payer: Self-pay | Source: Ambulatory Visit

## 2016-06-02 SURGERY — COLONOSCOPY WITH PROPOFOL
Anesthesia: General

## 2016-07-04 ENCOUNTER — Other Ambulatory Visit: Payer: Self-pay | Admitting: Gastroenterology

## 2016-07-04 DIAGNOSIS — R7989 Other specified abnormal findings of blood chemistry: Secondary | ICD-10-CM

## 2016-07-04 DIAGNOSIS — R945 Abnormal results of liver function studies: Principal | ICD-10-CM

## 2016-07-05 ENCOUNTER — Ambulatory Visit
Admission: RE | Admit: 2016-07-05 | Discharge: 2016-07-05 | Disposition: A | Discharge status: Home / Self Care | Payer: BLUE CROSS/BLUE SHIELD | Source: Ambulatory Visit | Attending: Gastroenterology | Admitting: Gastroenterology

## 2016-07-05 DIAGNOSIS — R945 Abnormal results of liver function studies: Secondary | ICD-10-CM

## 2016-07-05 DIAGNOSIS — K760 Fatty (change of) liver, not elsewhere classified: Secondary | ICD-10-CM | POA: Diagnosis not present

## 2016-07-05 DIAGNOSIS — R7989 Other specified abnormal findings of blood chemistry: Secondary | ICD-10-CM | POA: Diagnosis not present
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
## 2016-07-07 ENCOUNTER — Ambulatory Visit
Admission: RE | Admit: 2016-07-07 | Discharge: 2016-07-07 | Disposition: A | Discharge status: Home / Self Care | Payer: BLUE CROSS/BLUE SHIELD | Source: Ambulatory Visit | Attending: Gastroenterology | Admitting: Gastroenterology

## 2016-07-07 ENCOUNTER — Ambulatory Visit: Payer: BLUE CROSS/BLUE SHIELD | Admitting: Anesthesiology

## 2016-07-07 ENCOUNTER — Encounter
Admission: RE | Disposition: A | Discharge status: Home / Self Care | Payer: Self-pay | Source: Ambulatory Visit | Attending: Gastroenterology

## 2016-07-07 ENCOUNTER — Encounter: Payer: Self-pay | Admitting: *Deleted

## 2016-07-07 DIAGNOSIS — Z888 Allergy status to other drugs, medicaments and biological substances status: Secondary | ICD-10-CM | POA: Insufficient documentation

## 2016-07-07 DIAGNOSIS — E669 Obesity, unspecified: Secondary | ICD-10-CM | POA: Diagnosis not present

## 2016-07-07 DIAGNOSIS — R1013 Epigastric pain: Secondary | ICD-10-CM | POA: Diagnosis present

## 2016-07-07 DIAGNOSIS — E282 Polycystic ovarian syndrome: Secondary | ICD-10-CM | POA: Diagnosis not present

## 2016-07-07 DIAGNOSIS — K210 Gastro-esophageal reflux disease with esophagitis: Secondary | ICD-10-CM | POA: Diagnosis not present

## 2016-07-07 DIAGNOSIS — Z85520 Personal history of malignant carcinoid tumor of kidney: Secondary | ICD-10-CM | POA: Diagnosis not present

## 2016-07-07 DIAGNOSIS — K5730 Diverticulosis of large intestine without perforation or abscess without bleeding: Secondary | ICD-10-CM | POA: Diagnosis not present

## 2016-07-07 DIAGNOSIS — Z87891 Personal history of nicotine dependence: Secondary | ICD-10-CM | POA: Diagnosis not present

## 2016-07-07 DIAGNOSIS — D631 Anemia in chronic kidney disease: Secondary | ICD-10-CM | POA: Insufficient documentation

## 2016-07-07 DIAGNOSIS — E785 Hyperlipidemia, unspecified: Secondary | ICD-10-CM | POA: Diagnosis not present

## 2016-07-07 DIAGNOSIS — G43909 Migraine, unspecified, not intractable, without status migrainosus: Secondary | ICD-10-CM | POA: Insufficient documentation

## 2016-07-07 DIAGNOSIS — E039 Hypothyroidism, unspecified: Secondary | ICD-10-CM | POA: Insufficient documentation

## 2016-07-07 DIAGNOSIS — Z6841 Body Mass Index (BMI) 40.0 and over, adult: Secondary | ICD-10-CM | POA: Insufficient documentation

## 2016-07-07 DIAGNOSIS — I129 Hypertensive chronic kidney disease with stage 1 through stage 4 chronic kidney disease, or unspecified chronic kidney disease: Secondary | ICD-10-CM | POA: Diagnosis not present

## 2016-07-07 DIAGNOSIS — K3189 Other diseases of stomach and duodenum: Secondary | ICD-10-CM | POA: Insufficient documentation

## 2016-07-07 DIAGNOSIS — Z91040 Latex allergy status: Secondary | ICD-10-CM | POA: Insufficient documentation

## 2016-07-07 DIAGNOSIS — N189 Chronic kidney disease, unspecified: Secondary | ICD-10-CM | POA: Insufficient documentation

## 2016-07-07 HISTORY — PX: ESOPHAGOGASTRODUODENOSCOPY (EGD) WITH PROPOFOL: SHX5813

## 2016-07-07 HISTORY — DX: Chronic kidney disease, unspecified: N18.9

## 2016-07-07 HISTORY — PX: COLONOSCOPY WITH PROPOFOL: SHX5780

## 2016-07-07 SURGERY — COLONOSCOPY WITH PROPOFOL
Anesthesia: General

## 2016-07-07 MED ORDER — SODIUM CHLORIDE 0.9 % IV SOLN
INTRAVENOUS | Status: DC | PRN
  Administered 2016-07-07: 09:00:00 via INTRAVENOUS

## 2016-07-07 MED ORDER — EPHEDRINE SULFATE 50 MG/ML IJ SOLN
INTRAMUSCULAR | Status: DC | PRN
  Administered 2016-07-07: 10 mg via INTRAVENOUS

## 2016-07-07 MED ORDER — LIDOCAINE 2% (20 MG/ML) 5 ML SYRINGE
INTRAMUSCULAR | Status: DC | PRN
  Administered 2016-07-07: 40 mg via INTRAVENOUS

## 2016-07-07 MED ORDER — SODIUM CHLORIDE 0.9 % IV SOLN: INTRAVENOUS | Status: DC

## 2016-07-07 MED ORDER — PHENYLEPHRINE HCL 10 MG/ML IJ SOLN
INTRAMUSCULAR | Status: DC | PRN
  Administered 2016-07-07 (×2): 100 ug via INTRAVENOUS

## 2016-07-07 MED ORDER — PROPOFOL 500 MG/50ML IV EMUL
INTRAVENOUS | Status: DC | PRN
  Administered 2016-07-07: 140 ug/kg/min via INTRAVENOUS

## 2016-07-07 MED ORDER — FENTANYL CITRATE (PF) 100 MCG/2ML IJ SOLN
INTRAMUSCULAR | Status: DC | PRN
  Administered 2016-07-07: 50 ug via INTRAVENOUS

## 2016-07-07 MED ORDER — PROPOFOL 10 MG/ML IV BOLUS
INTRAVENOUS | Status: DC | PRN
  Administered 2016-07-07: 100 mg via INTRAVENOUS

## 2016-07-07 MED ORDER — MIDAZOLAM HCL 5 MG/5ML IJ SOLN
INTRAMUSCULAR | Status: DC | PRN
  Administered 2016-07-07: 1 mg via INTRAVENOUS

## 2016-07-07 NOTE — Transfer of Care (Signed)
Immediate Anesthesia Transfer of Care Note  Patient: Anastyn Howison  Procedure(s) Performed: Procedure(s): COLONOSCOPY WITH PROPOFOL (N/A) ESOPHAGOGASTRODUODENOSCOPY (EGD) WITH PROPOFOL (N/A)  Patient Location: PACU and Endoscopy Unit  Anesthesia Type:General  Level of Consciousness: awake, oriented and patient cooperative  Airway & Oxygen Therapy: Patient Spontanous Breathing and Patient connected to nasal cannula oxygen  Post-op Assessment: Report given to RN and Post -op Vital signs reviewed and stable  Post vital signs: Reviewed and stable  Last Vitals:  Vitals:   07/07/16 0925  BP: 133/74  Pulse: 61  Resp: 20  Temp: 36.3 C    Last Pain:  Vitals:   07/07/16 0925  TempSrc: Tympanic         Complications: No apparent anesthesia complications

## 2016-07-07 NOTE — Op Note (Addendum)
[facility] Gastroenterology Patient Name: Teresa Short Procedure Date: 07/07/2016 9:38 AM MRN: [id number] Account #: [id number] Date of Birth: 16-Feb-1966 Admit Type: Outpatient Age: 50 Room: [facility] ENDO ROOM 1 Gender: Female Note Status: Finalized Procedure:            Upper GI endoscopy Indications:          Epigastric abdominal pain, Dyspepsia Providers:            Lollie Sails, MD Referring MD:         Dani Gobble. Dema Severin, MD (Referring MD) Medicines:            Monitored Anesthesia Care Complications:        No immediate complications. Procedure:            Pre-Anesthesia Assessment:                       - ASA Grade Assessment: III - A patient with severe                        systemic disease.                       After obtaining informed consent, the endoscope was                        passed under direct vision. Throughout the procedure,                        the patient's blood pressure, pulse, and oxygen                        saturations were monitored continuously. The Endoscope                        was introduced through the mouth, and advanced to the                        third part of duodenum. The upper GI endoscopy was                        accomplished without difficulty. The patient tolerated                        the procedure well. Findings:      The Z-line was variable. Biopsies were taken with a cold forceps for       histology.      The exam of the esophagus was otherwise normal.      The entire examined stomach was normal. Biopsies were taken with a cold       forceps for histology.      Diffuse mild mucosal variance characterized by smoothness was found in       the entire duodenum. Biopsies were taken with a cold forceps for       histology.      The cardia and gastric fundus were normal on retroflexion. Impression:           - Z-line variable. Biopsied.                       - Normal stomach. Biopsied.        - Mucosal  variant in the duodenum. Biopsied. Recommendation:       - Continue present medications.                       - Return to GI clinic in 3 weeks. Procedure Code(s):    --- Professional ---                       [phone], Esophagogastroduodenoscopy, flexible, transoral;                        with biopsy, single or multiple Diagnosis Code(s):    --- Professional ---                       K22.8, Other specified diseases of esophagus                       K31.89, Other diseases of stomach and duodenum                       R10.13, Epigastric pain CPT copyright 2016 American Medical Association. All rights reserved. The codes documented in this report are preliminary and upon coder review may  be revised to meet current compliance requirements. Lollie Sails, MD 07/07/2016 10:36:46 AM This report has been signed electronically. Number of Addenda: 0 Note Initiated On: 07/07/2016 9:38 AM      [facility]

## 2016-07-07 NOTE — Anesthesia Postprocedure Evaluation (Signed)
Anesthesia Post Note  Patient: Teresa Short  Procedure(s) Performed: Procedure(s) (LRB): COLONOSCOPY WITH PROPOFOL (N/A) ESOPHAGOGASTRODUODENOSCOPY (EGD) WITH PROPOFOL (N/A)  Patient location during evaluation: Endoscopy Anesthesia Type: General Level of consciousness: awake and alert and oriented Pain management: pain level controlled Vital Signs Assessment: post-procedure vital signs reviewed and stable Respiratory status: spontaneous breathing, nonlabored ventilation and respiratory function stable Cardiovascular status: blood pressure returned to baseline and stable Postop Assessment: no signs of nausea or vomiting Anesthetic complications: no    Last Vitals:  Vitals:   07/07/16 1105 07/07/16 1115  BP: 118/75 111/60  Pulse: 71 65  Resp: 16 15  Temp: 36.5 C     Last Pain:  Vitals:   07/07/16 1105  TempSrc: Tympanic                 Lakishia Bourassa

## 2016-07-07 NOTE — Anesthesia Preprocedure Evaluation (Signed)
Anesthesia Evaluation  Patient identified by MRN, date of birth, ID band Patient awake    Reviewed: Allergy & Precautions, NPO status , Patient's Chart, lab work & pertinent test results  History of Anesthesia Complications Negative for: history of anesthetic complications  Airway Mallampati: II  TM Distance: >3 FB Neck ROM: Full    Dental no notable dental hx.    Pulmonary neg sleep apnea, neg COPD, former smoker,    breath sounds clear to auscultation- rhonchi (-) wheezing      Cardiovascular hypertension, Pt. on medications (-) CAD and (-) Past MI  Rhythm:Regular Rate:Normal - Systolic murmurs and - Diastolic murmurs    Neuro/Psych  Headaches, negative psych ROS   GI/Hepatic negative GI ROS, Neg liver ROS,   Endo/Other  neg diabetesHypothyroidism   Renal/GU negative Renal ROS     Musculoskeletal   Abdominal (+) + obese,   Peds  Hematology  (+) anemia ,   Anesthesia Other Findings Past Medical History: No date: Anemia 2015: Cancer ([place])     Comment: Renal cell-tumor removed No date: Chickenpox No date: Chronic kidney disease No date: Gastritis     Comment: EROSIVE No date: Hyperlipidemia No date: Hypertension No date: Hypothyroidism No date: Migraine No date: Polycystic ovary No date: Uterine fibroid   Reproductive/Obstetrics                             Anesthesia Physical Anesthesia Plan  ASA: II  Anesthesia Plan: General   Post-op Pain Management:    Induction: Intravenous  Airway Management Planned: Natural Airway  Additional Equipment:   Intra-op Plan:   Post-operative Plan:   Informed Consent: I have reviewed the patients History and Physical, chart, labs and discussed the procedure including the risks, benefits and alternatives for the proposed anesthesia with the patient or authorized representative who has indicated his/her understanding and acceptance.    Dental advisory given  Plan Discussed with: Anesthesiologist and CRNA  Anesthesia Plan Comments:         Anesthesia Quick Evaluation

## 2016-07-07 NOTE — Op Note (Signed)
[facility] Gastroenterology Patient Name: Teresa Short Procedure Date: 07/07/2016 9:36 AM MRN: [id number] Account #: [id number] Date of Birth: [birth year] Admit Type: Outpatient Age: 50 Room: [facility] ENDO ROOM 1 Gender: Female Note Status: Finalized Procedure:            Colonoscopy Indications:          Change in bowel habits, Diarrhea Providers:            Lollie Sails, MD Referring MD:         Dani Gobble. Dema Severin, MD (Referring MD) Medicines:            Monitored Anesthesia Care Complications:        No immediate complications. Procedure:            Pre-Anesthesia Assessment:                       - ASA Grade Assessment: III - A patient with severe                        systemic disease.                       After obtaining informed consent, the colonoscope was                        passed under direct vision. Throughout the procedure,                        the patient's blood pressure, pulse, and oxygen                        saturations were monitored continuously. The                        Colonoscope was introduced through the anus and                        advanced to the the cecum, identified by appendiceal                        orifice and ileocecal valve. The colonoscopy was                        performed without difficulty. The patient tolerated the                        procedure well. The quality of the bowel preparation                        was good. Findings:      Multiple small and large-mouthed diverticula were found in the sigmoid       colon, proximal sigmoid colon and distal descending colon.      The exam was otherwise without abnormality.      The retroflexed view of the distal rectum and anal verge was normal and       showed no anal or rectal abnormalities.      Biopsies for histology were taken with a cold forceps from the right       colon and left colon for evaluation of microscopic colitis.      The digital rectal  exam was normal. Impression:           - Diverticulosis in the sigmoid colon, in the proximal                        sigmoid colon and in the distal descending colon.                       - The examination was otherwise normal.                       - The distal rectum and anal verge are normal on                        retroflexion view.                       - Biopsies were taken with a cold forceps from the                        right colon and left colon for evaluation of                        microscopic colitis. Recommendation:       - Discharge patient to home.                       - Soft diet today.                       - Discharge patient to home. Procedure Code(s):    --- Professional ---                       [phone], Colonoscopy, flexible; with biopsy, single or                        multiple Diagnosis Code(s):    --- Professional ---                       R19.4, Change in bowel habit                       R19.7, Diarrhea, unspecified                       K57.30, Diverticulosis of large intestine without                        perforation or abscess without bleeding CPT copyright 2016 American Medical Association. All rights reserved. The codes documented in this report are preliminary and upon coder review may  be revised to meet current compliance requirements. Lollie Sails, MD 07/07/2016 11:03:30 AM This report has been signed electronically. Number of Addenda: 0 Note Initiated On: 07/07/2016 9:36 AM Scope Withdrawal Time: 0 hours 9 minutes 7 seconds  Total Procedure Duration: 0 hours 18 minutes 49 seconds       [facility]

## 2016-07-07 NOTE — H&P (Signed)
Outpatient short stay form Pre-procedure 07/07/2016 9:43 AM Teresa Sails MD  Primary Physician: Eulogio Bear NP  Reason for visit:  EGD and colonoscopy  History of present illness:  Patient is a 50 year old female presenting today as above. She has a chronic history of irregular bowel habits and dyspepsia symptoms. She has also however been getting some epigastric abdominal pain as well as left upper quadrant pain that seems at times radiates toward her back. Her bowel habits are variable. She has been started on a proton pump inhibitor as well as a dose of Carafate and this seems to have been of benefit particularly the latter. She does have a family history of gallbladder issues with her sister having a cholecystectomy.    Current Facility-Administered Medications:  .  0.9 %  sodium chloride infusion, , Intravenous, Continuous, Teresa Sails, MD .  0.9 %  sodium chloride infusion, , Intravenous, Continuous, Teresa Sails, MD  Facility-Administered Medications Ordered in Other Encounters:  .  0.9 %  sodium chloride infusion, , , Continuous PRN, Courtney Paris, CRNA  Prescriptions Prior to Admission  Medication Sig Dispense Refill Last Dose  . Biotin (BIOTIN 5000) 5 MG CAPS Take by mouth.   Past Week at Unknown time  . Black Cohosh 540 MG CAPS Take 540 mg by mouth daily.   Past Month at Unknown time  . cetirizine (ZYRTEC) 10 MG tablet Take 10 mg by mouth at bedtime.    Past Week at Unknown time  . citalopram (CELEXA) 40 MG tablet Take 40 mg by mouth daily.   07/06/2016 at Unknown time  . levothyroxine (SYNTHROID, LEVOTHROID) 75 MCG tablet Take 75 mcg by mouth daily before breakfast.   07/06/2016 at Unknown time  . losartan (COZAAR) 25 MG tablet Take 25 mg by mouth daily.   07/07/2016 at 0815  . Omega-3 Fatty Acids (FISH OIL) 1000 MG CAPS Take 2 capsules by mouth every morning.   Past Week at Unknown time  . pantoprazole (PROTONIX) 40 MG tablet Take 40 mg by mouth daily.    07/06/2016 at Unknown time  . sucralfate (CARAFATE) 1 g tablet Take 1 g by mouth 4 (four) times daily -  with meals and at bedtime.   07/06/2016 at Unknown time  . traZODone (DESYREL) 50 MG tablet Take 50 mg by mouth at bedtime.   Past Week at Unknown time     Allergies  Allergen Reactions  . Dm-Apap-Cpm Other (See Comments)    Unknown, childhood allergy Unknown, childhood allergy  . Latex   . Lisinopril Cough  . Other Other (See Comments)    Unknown, childhood allergy     Past Medical History:  Diagnosis Date  . Anemia   . Cancer [facility]) 2015   Renal cell-tumor removed  . Chickenpox   . Chronic kidney disease   . Gastritis    EROSIVE  . Hyperlipidemia   . Hypertension   . Hypothyroidism   . Migraine   . Polycystic ovary   . Uterine fibroid     Review of systems:      Physical Exam    Heart and lungs: Regular rate and rhythm without rub or gallop, lungs are bilaterally clear.    HEENT: Normocephalic atraumatic eyes are anicteric    Other:     Pertinant exam for procedure: Soft, obese, tenderness to palpation in the left upper quadrant with minimal discomfort in the right upper quadrant. There are no apparent masses or rebound. Bowel sounds are  posi    Planned proceedures: EGD and colonoscopy with indicated procedures. I have discussed the risks benefits and complications of procedures to include not limited to bleeding, infection, perforation and the risk of sedation and the patient wishes to proceed.    Teresa Sails, MD Gastroenterology 07/07/2016  9:43 AM

## 2016-07-08 LAB — SURGICAL PATHOLOGY

## 2017-09-20 ENCOUNTER — Ambulatory Visit
Admission: RE | Admit: 2017-09-20 | Discharge: 2017-09-20 | Disposition: A | Discharge status: Home / Self Care | Payer: Commercial Managed Care - PPO | Source: Ambulatory Visit | Attending: Unknown Physician Specialty | Admitting: Unknown Physician Specialty

## 2017-09-20 ENCOUNTER — Other Ambulatory Visit: Payer: Self-pay | Admitting: Unknown Physician Specialty

## 2017-09-20 DIAGNOSIS — R05 Cough: Secondary | ICD-10-CM

## 2017-09-20 DIAGNOSIS — R059 Cough, unspecified: Secondary | ICD-10-CM

## 2017-09-21 ENCOUNTER — Encounter: Payer: Self-pay | Admitting: Internal Medicine

## 2017-09-21 ENCOUNTER — Encounter: Payer: Self-pay | Admitting: *Deleted

## 2017-09-21 ENCOUNTER — Ambulatory Visit (INDEPENDENT_AMBULATORY_CARE_PROVIDER_SITE_OTHER): Payer: Commercial Managed Care - PPO | Admitting: Internal Medicine

## 2017-09-21 VITALS — BP 118/82 | HR 75 | Ht 68.0 in | Wt 247.0 lb

## 2017-09-21 DIAGNOSIS — R05 Cough: Secondary | ICD-10-CM | POA: Diagnosis not present

## 2017-09-21 DIAGNOSIS — R059 Cough, unspecified: Secondary | ICD-10-CM

## 2017-09-21 MED ORDER — ALBUTEROL SULFATE HFA 108 (90 BASE) MCG/ACT IN AERS
2.0000 | INHALATION_SPRAY | Freq: Four times a day (QID) | RESPIRATORY_TRACT | 2 refills | Status: DC | PRN
Start: 2017-09-21 — End: 2018-07-06

## 2017-09-21 MED ORDER — FLUTICASONE PROPIONATE HFA 110 MCG/ACT IN AERO
2.0000 | INHALATION_SPRAY | Freq: Two times a day (BID) | RESPIRATORY_TRACT | 2 refills | Status: DC
Start: 2017-09-21 — End: 2021-08-18

## 2017-09-21 NOTE — Patient Instructions (Addendum)
Obtain PFT's Start albuterol as needed 2-4 puffs  every 4 hrs  Start Flovent as prescribed

## 2017-09-21 NOTE — Progress Notes (Signed)
Name: Teresa Short MRN: [id number] DOB: [birth year]     CONSULTATION DATE: 1.31.19 REFERRING MD :  Tami Ribas  CHIEF COMPLAINT: Cough  STUDIES:  CXR 1.30.19 I have Independently reviewed images of  CXR  on 09/21/2017 Interpretation: no acute opacities, no effussions    HISTORY OF PRESENT ILLNESS:   52 year old white female seen today for assessment for cough Patient is a former smoker she states she quit approximately 3 years ago Approximately 1 pack a day for 25 years  Patient has had cough and  SOB and wheezing for last several months and had been given prednisone and ABX and she has significant improvement in her symptoms  Now that she is off her Prednisone, she still has raspy voice, cough, wheezing and SOB No signs of infection at this time  Patient has chronic allergic rhinitis for many years and seems to be controlled with Zyrtec Patient also has GERD and seems to be controlled with Protonix  Patient also states that she has hoarse voice from thrush but I do not see any evidence on exam Patient has been prescribed albuterol as needed and seems to be helping   PAST MEDICAL HISTORY :   has a past medical history of Anemia, Cancer ([place]) (2015), Chickenpox, Chronic kidney disease, Gastritis, Hyperlipidemia, Hypertension, Hypothyroidism, Migraine, Polycystic ovary, and Uterine fibroid.  has a past surgical history that includes Partial nephrectomy (Sept 2015); UTERINE ABLATION; Carpal tunnel release (Bilateral, [phone]); Esophagogastroduodenoscopy (N/A, 02/24/2015); Dilation and curettage of uterus; Flexor tenotomy (Right, 05/22/2015); Appendectomy (June 2015); Colonoscopy with propofol (N/A, 07/07/2016); and Esophagogastroduodenoscopy (egd) with propofol (N/A, 07/07/2016). Prior to Admission medications   Medication Sig Start Date End Date Taking? Authorizing Provider  Biotin (BIOTIN 5000) 5 MG CAPS Take by mouth.    [provider]  Black Cohosh 540 MG CAPS Take 540  mg by mouth daily.    [provider]  cetirizine (ZYRTEC) 10 MG tablet Take 10 mg by mouth at bedtime.     [provider]  citalopram (CELEXA) 40 MG tablet Take 40 mg by mouth daily.    [provider]  levothyroxine (SYNTHROID, LEVOTHROID) 75 MCG tablet Take 75 mcg by mouth daily before breakfast.    [provider]  losartan (COZAAR) 25 MG tablet Take 25 mg by mouth daily.    [provider]  Omega-3 Fatty Acids (FISH OIL) 1000 MG CAPS Take 2 capsules by mouth every morning.    [provider]  pantoprazole (PROTONIX) 40 MG tablet Take 40 mg by mouth daily.    [provider]  sucralfate (CARAFATE) 1 g tablet Take 1 g by mouth 4 (four) times daily -  with meals and at bedtime.    [provider]  traZODone (DESYREL) 50 MG tablet Take 50 mg by mouth at bedtime.    [provider]   Allergies  Allergen Reactions  . Dm-Apap-Cpm Other (See Comments)    Unknown, childhood allergy Unknown, childhood allergy  . Latex   . Lisinopril Cough  . Other Other (See Comments)    Unknown, childhood allergy    FAMILY HISTORY:  HTN, DM  SOCIAL HISTORY:  reports that she quit smoking about 3 years ago. Her smoking use included cigarettes. She quit after 25.00 years of use. she has never used smokeless tobacco. She reports that she drinks alcohol. She reports that she does not use drugs.  REVIEW OF SYSTEMS:   Constitutional: Negative for fever, chills, weight loss, malaise/fatigue and diaphoresis.  HENT: Negative for hearing loss, ear pain, nosebleeds, congestion, sore throat, neck pain, tinnitus and ear discharge.   Eyes: Negative for blurred vision, double vision, photophobia, pain, discharge and redness.  Respiratory: + cough, hemoptysis, -sputum production,+ shortness of breath, +wheezing and stridor.   Cardiovascular: Negative for chest pain, palpitations, orthopnea, claudication, leg swelling and PND.    Gastrointestinal: Negative for heartburn, nausea, vomiting, abdominal pain, diarrhea, constipation, blood in stool and melena.  Genitourinary: Negative for dysuria, urgency, frequency, hematuria and flank pain.  Musculoskeletal: Negative for myalgias, back pain, joint pain and falls.  Skin: Negative for itching and rash.  Neurological: Negative for dizziness, tingling, tremors, sensory change, speech change, focal weakness, seizures, loss of consciousness, weakness and headaches.  Endo/Heme/Allergies: Negative for environmental allergies and polydipsia. Does not bruise/bleed easily.  ALL OTHER ROS ARE NEGATIVE    BP 118/82 (BP Location: Left Arm, Cuff Size: Normal)   Pulse 75   Ht 5\' 8"  (1.727 m)   Wt 247 lb (112 kg)   SpO2 98%   BMI 37.56 kg/m   Physical Examination:   GENERAL:NAD, no fevers, chills, no weakness no fatigue HEAD: Normocephalic, atraumatic.  EYES: Pupils equal, round, reactive to light. Extraocular muscles intact. No scleral icterus.  MOUTH: Moist mucosal membrane.   EAR, NOSE, THROAT: Clear without exudates. No external lesions.  NECK: Supple. No thyromegaly. No nodules. No JVD.  PULMONARY:CTA B/L no wheezes, no crackles, no rhonchi CARDIOVASCULAR: S1 and S2. Regular rate and rhythm. No murmurs, rubs, or gallops. No edema.  GASTROINTESTINAL: Soft, nontender, nondistended. No masses. Positive bowel sounds.  MUSCULOSKELETAL: No swelling, clubbing, or edema. Range of motion full in all extremities.  NEUROLOGIC: Cranial nerves II through XII are intact. No gross focal neurological deficits.  SKIN: No ulceration, lesions, rashes, or cyanosis. Skin warm and dry. Turgor intact.  PSYCHIATRIC: Mood, affect within normal limits. The patient is awake, alert and oriented x 3. Insight, judgment intact.      ASSESSMENT / PLAN: 52 yo pleasant white female seen today for ongoing Cough and SOB with wheezing and DOE in setting of extensive smoking history with signs and  symptoms of allergic rhinitis likely related to probable underlying reactive airways disease with COPD and with obesity and deconditioned state.  1.SOB and Wheezing-probable COPD with deconditioned state  2.Probable COPD-will need PFT's for further assessment -start Flovent(inhaled steroids) and assess resp status in 1 month -continue albuterol as needed and prior to exercise  3.GERD Continue PPI as prescribed  4.Allergic Rhinitis -continue zyrtec as prescribed   Patient  satisfied with Plan of action and management. All questions answered Follow up in 1 month for re-assessment   Derek Huneycutt Patricia Pesa, M.D.  [facility]  Medical Director [place] Director [facility] Cardio-Pulmonary Department

## 2017-09-29 ENCOUNTER — Ambulatory Visit (INDEPENDENT_AMBULATORY_CARE_PROVIDER_SITE_OTHER): Payer: Commercial Managed Care - PPO | Admitting: *Deleted

## 2017-09-29 DIAGNOSIS — R053 Chronic cough: Secondary | ICD-10-CM

## 2017-09-29 DIAGNOSIS — R05 Cough: Secondary | ICD-10-CM | POA: Diagnosis not present

## 2017-09-29 NOTE — Progress Notes (Signed)
SMW performed today.  SIX MIN WALK 09/29/2017  Medications Albuterol inhaler, Celexa, Losartan, Omega 3, Protonix, Carafate  Supplimental Oxygen during Test? (L/min) No  Laps 9  Partial Lap (in Meters) 24  Baseline BP (sitting) 118/82  Baseline Heartrate 77  Baseline Dyspnea (Borg Scale) 5  Baseline Fatigue (Borg Scale) 0  Baseline SPO2 99  BP (sitting) 170/110  Heartrate 135  Dyspnea (Borg Scale) 7  Fatigue (Borg Scale) 0  SPO2 94  BP (sitting) 170/106  Heartrate 79  SPO2 98  Stopped or Paused before Six Minutes No  Distance Completed 456  Tech Comments: Pt walked at fast pace w/o complications

## 2017-10-19 ENCOUNTER — Ambulatory Visit: Payer: Commercial Managed Care - PPO | Attending: Internal Medicine

## 2017-10-19 DIAGNOSIS — R05 Cough: Secondary | ICD-10-CM | POA: Diagnosis not present

## 2017-10-19 DIAGNOSIS — R059 Cough, unspecified: Secondary | ICD-10-CM

## 2017-10-27 ENCOUNTER — Ambulatory Visit (INDEPENDENT_AMBULATORY_CARE_PROVIDER_SITE_OTHER): Payer: Commercial Managed Care - PPO | Admitting: Internal Medicine

## 2017-10-27 ENCOUNTER — Encounter: Payer: Self-pay | Admitting: Internal Medicine

## 2017-10-27 VITALS — BP 110/78 | HR 69 | Ht 68.0 in | Wt 244.0 lb

## 2017-10-27 DIAGNOSIS — J4520 Mild intermittent asthma, uncomplicated: Secondary | ICD-10-CM | POA: Diagnosis not present

## 2017-10-27 NOTE — Patient Instructions (Addendum)
STOP SMOKING Inhalers as needed  Weight goal for next visit in 6 months is 230 pounds

## 2017-10-27 NOTE — Progress Notes (Signed)
   Name: Teresa Short MRN: [id number] DOB: 1966-06-09     CONSULTATION DATE: 1.31.19 REFERRING MD :  Tami Ribas  CHIEF COMPLAINT: Cough  STUDIES:  CXR 1.30.19 I have Independently reviewed images of  CXR   Interpretation: no acute opacities, no effussions    HISTORY OF PRESENT ILLNESS:   Patient feeling so much better with her breathing was on abx and prednisone for sinus infection 2 weeks ago  PFT's reviewed with patient No obvious obstructive or Restrictive lung disease  No signs of infection at this time Uses inhalers as needed    REVIEW OF SYSTEMS:   Constitutional: Negative for fever, chills, weight loss, malaise/fatigue and diaphoresis.  HENT: Negative for hearing loss, ear pain, nosebleeds, congestion, sore throat, neck pain, tinnitus and ear discharge.   Eyes: Negative for blurred vision, double vision, photophobia, pain, discharge and redness.  Respiratory: + cough, hemoptysis, -sputum production,+ shortness of breath, +wheezing and stridor.   Cardiovascular: Negative for chest pain, palpitations, orthopnea, claudication, leg swelling and PND.  Gastrointestinal: Negative for heartburn, nausea, vomiting, abdominal pain, diarrhea, constipation, blood in stool and melena.  Endo/Heme/Allergies: Negative for environmental allergies and polydipsia. Does not bruise/bleed easily.  ALL OTHER ROS ARE NEGATIVE    BP 110/78 (BP Location: Left Arm, Cuff Size: Normal)   Pulse 69   Ht 5\' 8"  (1.727 m)   Wt 244 lb (110.7 kg)   SpO2 100%   BMI 37.10 kg/m   Physical Examination:  GENERAL:NAD, no fevers, chills, no weakness no fatigue HEAD: Normocephalic, atraumatic.  EYES: Pupils equal, round, reactive to light. Extraocular muscles intact. No scleral icterus.  MOUTH: Moist mucosal membrane.   EAR, NOSE, THROAT: Clear without exudates. No external lesions.  NECK: Supple. No thyromegaly. No nodules. No JVD.  PULMONARY:CTA B/L no wheezes, no crackles, no  rhonchi CARDIOVASCULAR: S1 and S2. Regular rate and rhythm. No murmurs, rubs, or gallops. No edema.  GASTROINTESTINAL: Soft, nontender, nondistended. No masses. Positive bowel sounds.  MUSCULOSKELETAL: No swelling, clubbing, or edema. Range of motion full in all extremities.  NEUROLOGIC: Cranial nerves II through XII are intact. No gross focal neurological deficits.  SKIN: No ulceration, lesions, rashes, or cyanosis. Skin warm and dry. Turgor intact.  PSYCHIATRIC: Mood, affect within normal limits. The patient is awake, alert and oriented x 3. Insight, judgment intact.       ASSESSMENT / PLAN: 52 yo pleasant white female seen today for intermittent Cough and SOB with wheezing and DOE in setting of extensive smoking history with signs and symptoms of allergic rhinitis likely related to probable underlying reactive airways disease no evidence of COPD on PFT's  1.SOB and Wheezing-reactive airways disease from smoking -use inhalers as needed Exercises by boxing   No need of steroids or ABX at this time  3.GERD Continue PPI as prescribed  4.Allergic Rhinitis -continue zyrtec as prescribed  5.recommend weight loss Goal at next OV is 230 pounds  Patient  satisfied with Plan of action and management. All questions answered Follow up in 6 months   [name] Patricia Pesa, M.D.  [facility]  Medical Director [place] Director [facility] Cardio-Pulmonary Department

## 2018-04-30 ENCOUNTER — Other Ambulatory Visit: Payer: Self-pay | Admitting: Family Medicine

## 2018-04-30 DIAGNOSIS — Z1231 Encounter for screening mammogram for malignant neoplasm of breast: Secondary | ICD-10-CM

## 2018-05-03 ENCOUNTER — Ambulatory Visit
Admission: RE | Admit: 2018-05-03 | Discharge: 2018-05-03 | Disposition: A | Discharge status: Home / Self Care | Payer: Commercial Managed Care - PPO | Source: Ambulatory Visit | Attending: Family Medicine | Admitting: Family Medicine

## 2018-05-03 DIAGNOSIS — Z1231 Encounter for screening mammogram for malignant neoplasm of breast: Secondary | ICD-10-CM | POA: Diagnosis present

## 2018-05-07 ENCOUNTER — Other Ambulatory Visit: Payer: Self-pay | Admitting: Family Medicine

## 2018-05-07 DIAGNOSIS — R928 Other abnormal and inconclusive findings on diagnostic imaging of breast: Secondary | ICD-10-CM

## 2018-05-07 DIAGNOSIS — R921 Mammographic calcification found on diagnostic imaging of breast: Secondary | ICD-10-CM

## 2018-05-08 ENCOUNTER — Inpatient Hospital Stay
Admission: RE | Admit: 2018-05-08 | Discharge: 2018-05-08 | Disposition: A | Discharge status: Home / Self Care | Payer: Self-pay | Source: Ambulatory Visit | Attending: *Deleted | Admitting: *Deleted

## 2018-05-08 ENCOUNTER — Other Ambulatory Visit: Payer: Self-pay | Admitting: *Deleted

## 2018-05-08 DIAGNOSIS — Z9289 Personal history of other medical treatment: Secondary | ICD-10-CM

## 2018-05-09 ENCOUNTER — Ambulatory Visit
Admission: RE | Admit: 2018-05-09 | Discharge: 2018-05-09 | Disposition: A | Discharge status: Home / Self Care | Payer: Commercial Managed Care - PPO | Source: Ambulatory Visit | Attending: Family Medicine | Admitting: Family Medicine

## 2018-05-09 DIAGNOSIS — R928 Other abnormal and inconclusive findings on diagnostic imaging of breast: Secondary | ICD-10-CM

## 2018-05-09 DIAGNOSIS — R921 Mammographic calcification found on diagnostic imaging of breast: Secondary | ICD-10-CM

## 2018-05-29 ENCOUNTER — Other Ambulatory Visit: Payer: Self-pay | Admitting: Gastroenterology

## 2018-05-29 DIAGNOSIS — K760 Fatty (change of) liver, not elsewhere classified: Secondary | ICD-10-CM

## 2018-06-01 ENCOUNTER — Ambulatory Visit: Payer: Commercial Managed Care - PPO

## 2018-06-05 ENCOUNTER — Ambulatory Visit
Admission: RE | Admit: 2018-06-05 | Discharge: 2018-06-05 | Disposition: A | Discharge status: Home / Self Care | Payer: Commercial Managed Care - PPO | Source: Ambulatory Visit | Attending: Gastroenterology | Admitting: Gastroenterology

## 2018-06-05 DIAGNOSIS — K760 Fatty (change of) liver, not elsewhere classified: Secondary | ICD-10-CM | POA: Insufficient documentation

## 2018-06-13 ENCOUNTER — Ambulatory Visit: Payer: Commercial Managed Care - PPO | Admitting: Internal Medicine

## 2018-07-05 ENCOUNTER — Ambulatory Visit: Payer: Commercial Managed Care - PPO | Admitting: Internal Medicine

## 2018-07-06 ENCOUNTER — Encounter: Payer: Self-pay | Admitting: Internal Medicine

## 2018-07-06 ENCOUNTER — Ambulatory Visit: Payer: Commercial Managed Care - PPO | Admitting: Internal Medicine

## 2018-07-06 VITALS — BP 124/78 | HR 65 | Ht 68.0 in | Wt 253.0 lb

## 2018-07-06 DIAGNOSIS — J4520 Mild intermittent asthma, uncomplicated: Secondary | ICD-10-CM

## 2018-07-06 MED ORDER — ALBUTEROL SULFATE HFA 108 (90 BASE) MCG/ACT IN AERS: 2.0000 | INHALATION_SPRAY | Freq: Four times a day (QID) | RESPIRATORY_TRACT | 2 refills | Status: AC | PRN

## 2018-07-06 NOTE — Patient Instructions (Addendum)
Avoid second hand smoke exposure  Albuterol 2-4 puffs every 4 hrs as needed   FLU SHOT UP TO DATE

## 2018-07-06 NOTE — Progress Notes (Signed)
Name: Teresa Short MRN: [id number] DOB: 1966-02-20     CONSULTATION DATE: 1.31.19 REFERRING MD :  Tami Ribas  CHIEF COMPLAINT: Cough  STUDIES:  CXR 1.30.19 I have Independently reviewed images of  CXR   Interpretation: no acute opacities, no effussions    HISTORY OF PRESENT ILLNESS:   Patient respiratory status is stable at this time No signs of infecion no signs of heart failure Uses inhalers as needed  +second hand smoking exposure Has slight intermittent cough and wheezing  Advised to use inhalers as needed      Review of Systems:  Gen:  Denies  fever, sweats, chills weigh loss  HEENT: Denies blurred vision, double vision, ear pain, eye pain, hearing loss, nose bleeds, sore throat Cardiac:  No dizziness, chest pain or heaviness, chest tightness,edema, No JVD Resp:  + Cough, +SOB Gi: Denies swallowing difficulty, stomach pain, nausea or vomiting, diarrhea, constipation, bowel incontinence Gu:  Denies bladder incontinence, burning urine Ext:   Denies Joint pain, stiffness or swelling Skin: Denies  skin rash, easy bruising or bleeding or hives Endoc:  Denies polyuria, polydipsia , polyphagia or weight change Psych:   Denies depression, insomnia or hallucinations  Other:  All other systems negative   Ht 5\' 8"  (1.727 m)   Wt 253 lb (114.8 kg)   BMI 38.47 kg/m   BP 124/78 (BP Location: Left Arm, Cuff Size: Normal)   Pulse 65   Ht 5\' 8"  (1.727 m)   Wt 253 lb (114.8 kg)   SpO2 96%   BMI 38.47 kg/m    Physical Examination:   GENERAL:NAD, no fevers, chills, no weakness no fatigue HEAD: Normocephalic, atraumatic.  EYES: Pupils equal, round, reactive to light. Extraocular muscles intact. No scleral icterus.  MOUTH: Moist mucosal membrane. Dentition intact. No abscess noted.  EAR, NOSE, THROAT: Clear without exudates. No external lesions.  NECK: Supple. No thyromegaly. No nodules. No JVD.  PULMONARY: CTA B/L no wheezing, rhonchi, crackles CARDIOVASCULAR: S1  and S2. Regular rate and rhythm. No murmurs, rubs, or gallops. No edema. Pedal pulses 2+ bilaterally.  GASTROINTESTINAL: Soft, nontender, nondistended. No masses. Positive bowel sounds. No hepatosplenomegaly.  MUSCULOSKELETAL: No swelling, clubbing, or edema. Range of motion full in all extremities.  NEUROLOGIC: Cranial nerves II through XII are intact. No gross focal neurological deficits. Sensation intact. Reflexes intact.  SKIN: No ulceration, lesions, rashes, or cyanosis. Skin warm and dry. Turgor intact.  PSYCHIATRIC: Mood, affect within normal limits. The patient is awake, alert and oriented x 3. Insight, judgment intact.  ALL OTHER ROS ARE NEGATIVE       ASSESSMENT / PLAN: 52 -year-old pleasant white female seen today for intermittent cough and shortness of breath with wheezing and dyspnea on exertion in the setting of extensive smoking history with signs symptoms of allergic rhinitis most likely related to underlying reactive airways disease due to the fact there is no evidence of obstructive airways disease on pulmonary function testing   Shortness of breath and wheezing reactive airways disease from second hand smoking exposure Continue inhalers as prescribed and as needed No need for steroids or antibiotics at this time  Reflux PPI as prescribed   allergic rhinitis  continue Zyrtec as prescribed  Obesity -recommend significant weight loss -recommend changing diet Weight goal was 230 pounds  Deconditioned state -Recommend increased daily activity and exercise    Patient  satisfied with Plan of action and management. All questions answered  Corrin Parker, M.D.  [facility]  Medical Director [place] Director [facility] Cardio-Pulmonary Department

## 2019-02-27 IMAGING — MG MM DIGITAL DIAGNOSTIC UNILAT*R*
4 series · 4 of 4 positions shown · non-contrast
Comparison: Previous exam(s).

CLINICAL DATA: Right breast calcifications seen on most recent
screening mammography.

EXAM:
DIGITAL DIAGNOSTIC RIGHT MAMMOGRAM WITH CAD

[R CC (1 of 2)]
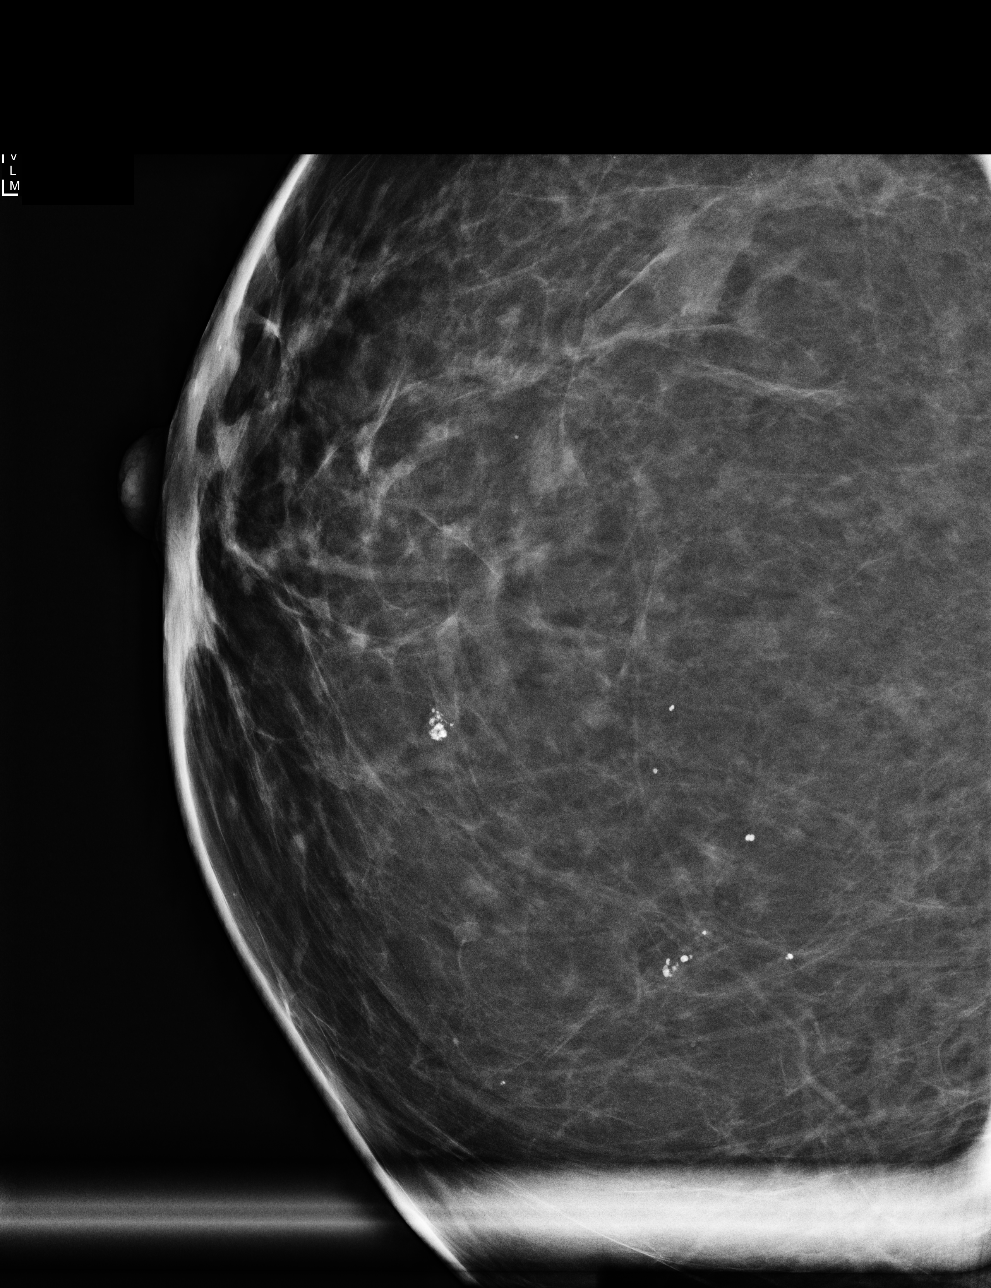

[R CC (2 of 2)]
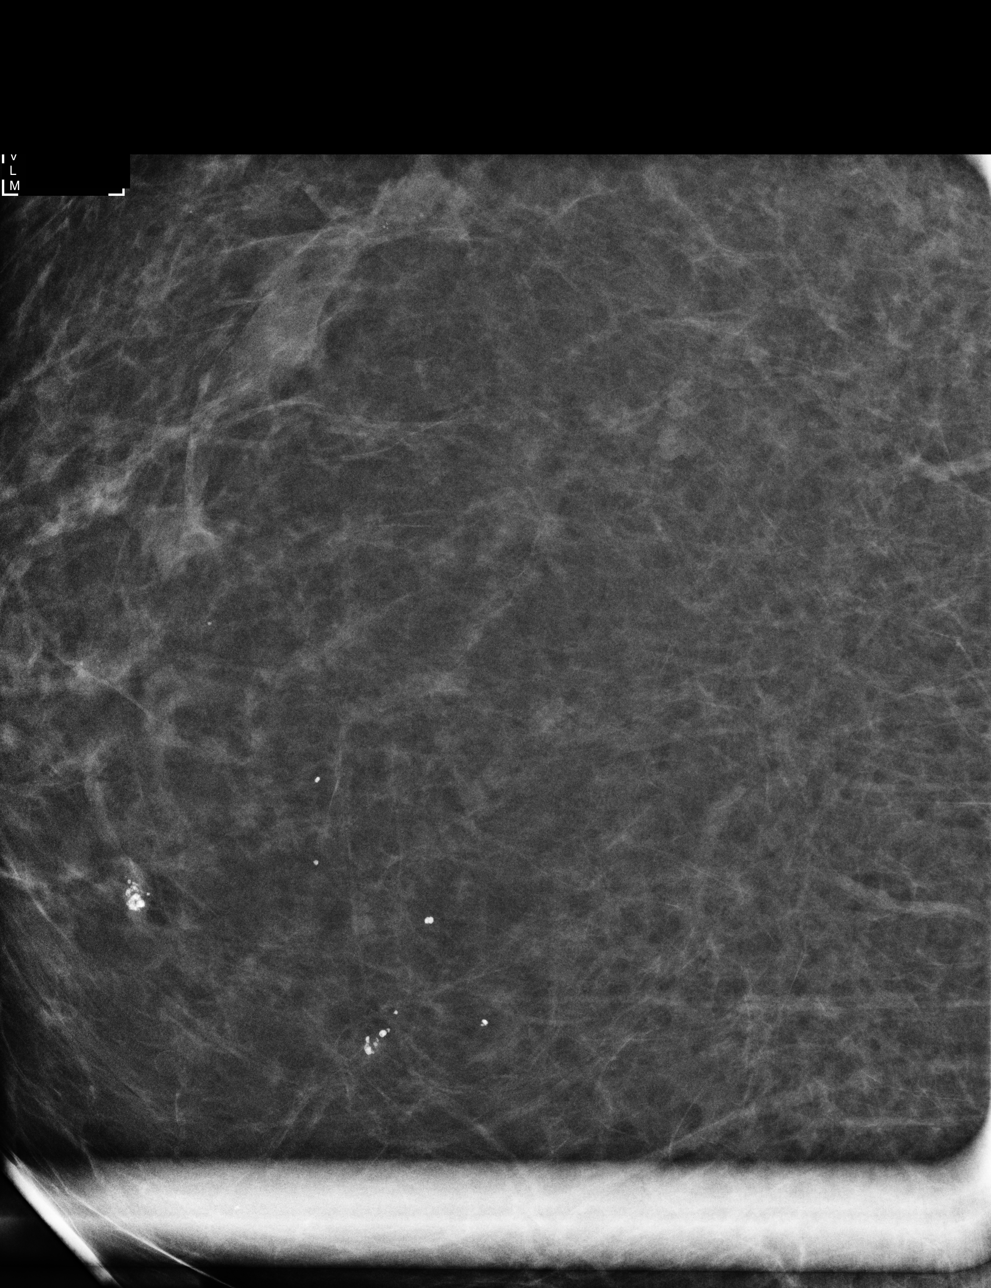

[R ML (1 of 2)]
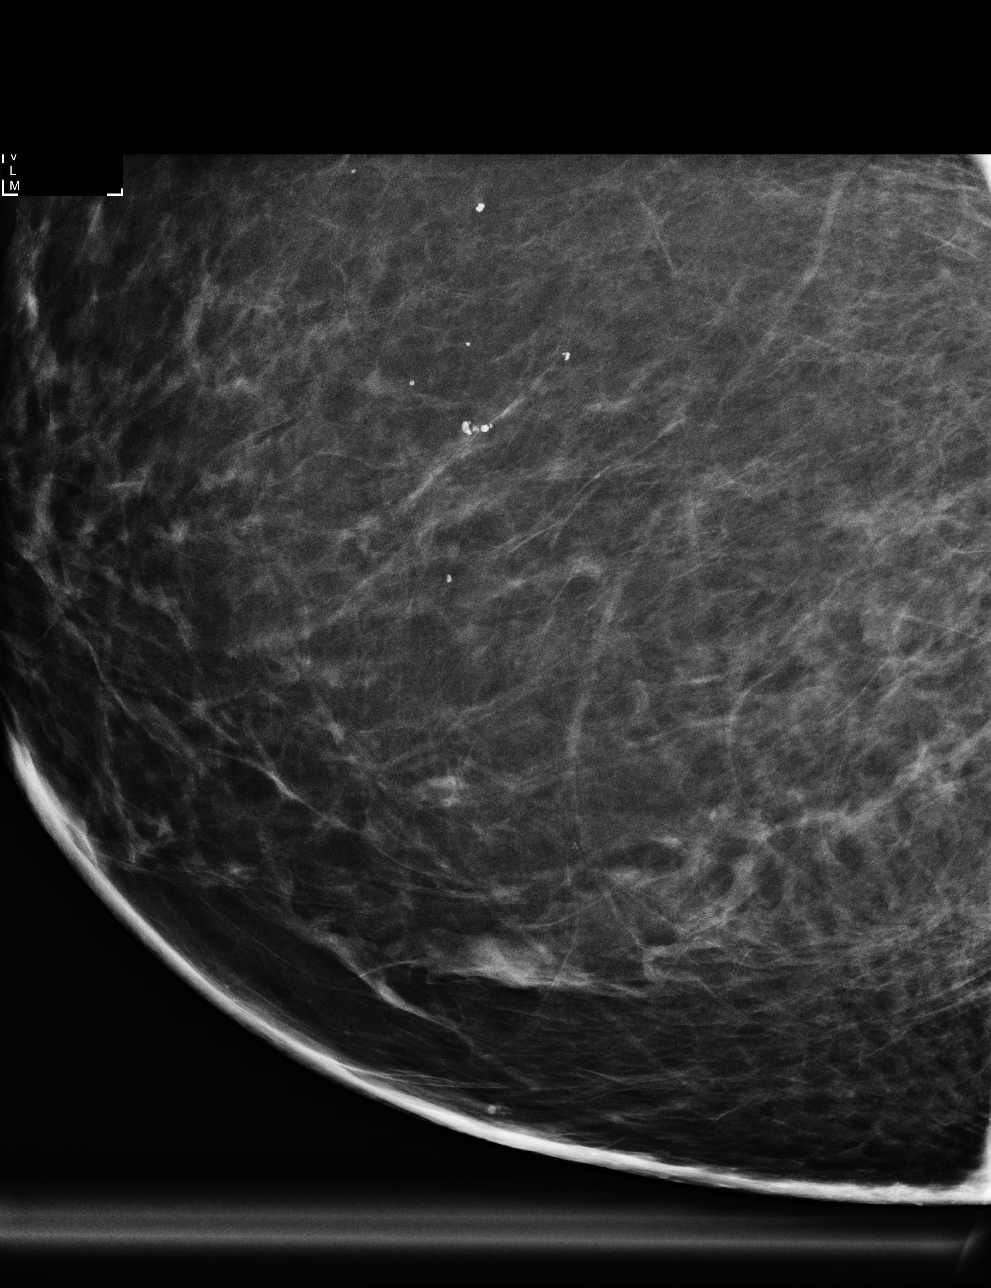

[R ML (2 of 2)]
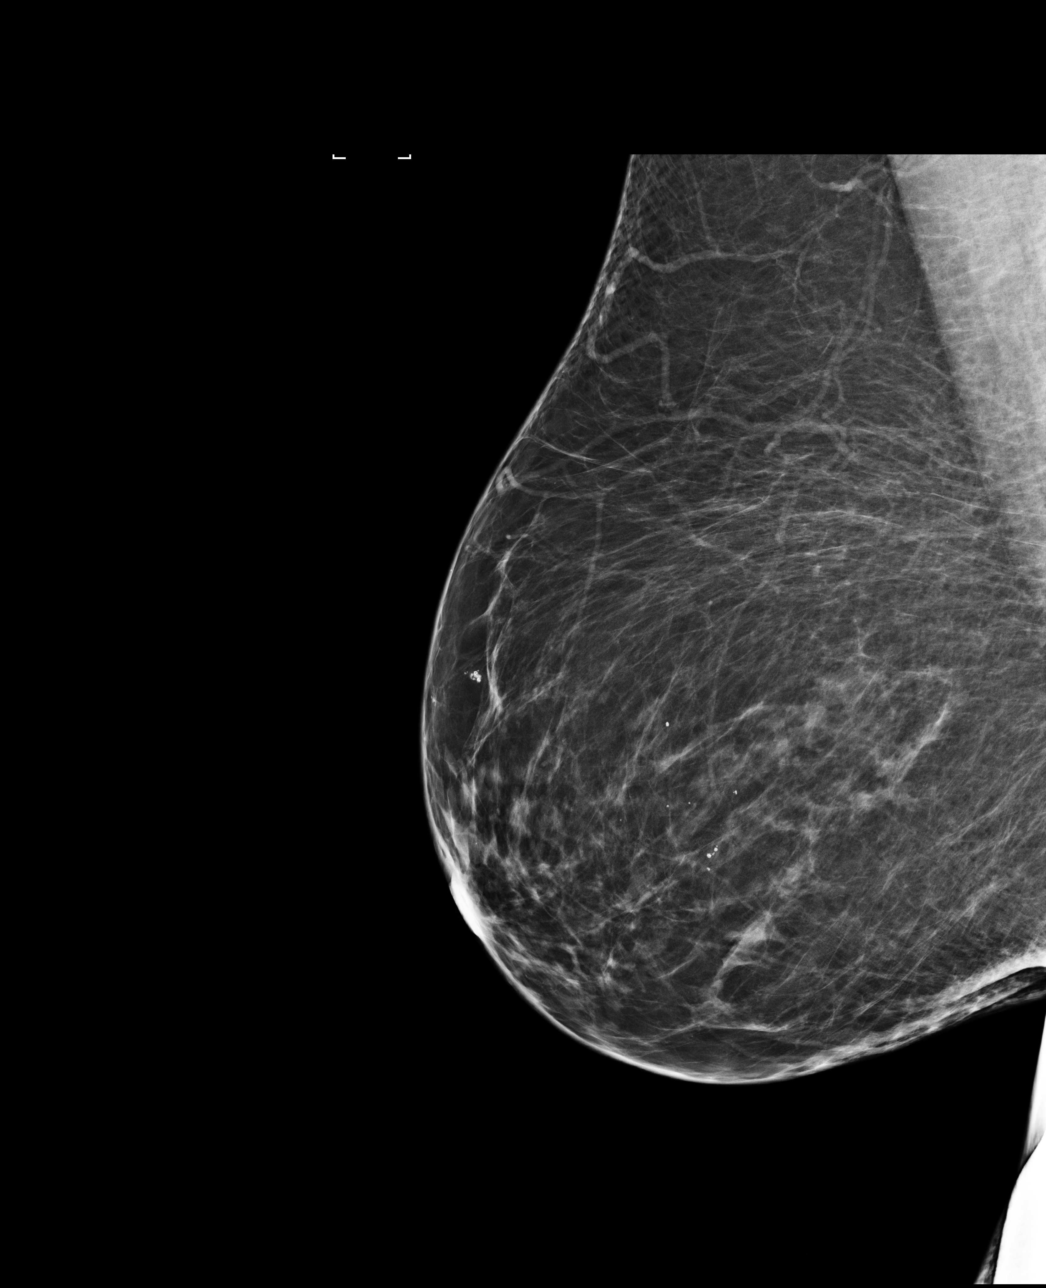

[4 of 4 positions shown; findings below may reference images not displayed]

ACR Breast Density Category b: There are scattered areas of
fibroglandular density.
FINDINGS: Additional mammographic views of the right breast demonstrate
several small clusters of benign popcorn type calcifications in the
right breast lower inner quadrant. No associated masses are seen.

Mammographic images were processed with CAD.
IMPRESSION: Benign right breast calcifications.

No mammographic evidence of malignancy in the right breast.

RECOMMENDATION:
Screening mammogram in one year.(Code:[id number])

I have discussed the findings and recommendations with the patient.
Results were also provided in writing at the conclusion of the
visit. If applicable, a reminder letter will be sent to the patient
regarding the next appointment.

BI-RADS CATEGORY  2: Benign.

## 2019-05-30 ENCOUNTER — Other Ambulatory Visit: Payer: Self-pay | Admitting: Gastroenterology

## 2019-05-30 DIAGNOSIS — K760 Fatty (change of) liver, not elsewhere classified: Secondary | ICD-10-CM

## 2019-06-06 ENCOUNTER — Other Ambulatory Visit: Payer: Self-pay

## 2019-06-06 ENCOUNTER — Ambulatory Visit
Admission: RE | Admit: 2019-06-06 | Discharge: 2019-06-06 | Disposition: A | Discharge status: Home / Self Care | Payer: Commercial Managed Care - PPO | Source: Ambulatory Visit | Attending: Gastroenterology | Admitting: Gastroenterology

## 2019-06-06 DIAGNOSIS — K760 Fatty (change of) liver, not elsewhere classified: Secondary | ICD-10-CM | POA: Diagnosis present

## 2019-12-16 ENCOUNTER — Ambulatory Visit: Payer: Commercial Managed Care - PPO | Attending: Internal Medicine

## 2019-12-31 ENCOUNTER — Other Ambulatory Visit: Payer: Self-pay | Admitting: Unknown Physician Specialty

## 2019-12-31 ENCOUNTER — Ambulatory Visit: Payer: Commercial Managed Care - PPO | Attending: Internal Medicine

## 2019-12-31 ENCOUNTER — Other Ambulatory Visit (HOSPITAL_COMMUNITY): Payer: Self-pay | Admitting: Unknown Physician Specialty

## 2019-12-31 DIAGNOSIS — R42 Dizziness and giddiness: Secondary | ICD-10-CM

## 2019-12-31 DIAGNOSIS — Z23 Encounter for immunization: Secondary | ICD-10-CM

## 2019-12-31 NOTE — Progress Notes (Signed)
   Covid-19 Vaccination Clinic  Name:  Teresa Short    MRN: [id number] DOB: [birth year]  12/31/2019  Teresa Short was observed post Covid-19 immunization for 15 minutes without incident. She was provided with Vaccine Information Sheet and instruction to access the V-Safe system.   Teresa Short was instructed to call 911 with any severe reactions post vaccine: Marland Kitchen Difficulty breathing  . Swelling of face and throat  . A fast heartbeat  . A bad rash all over body  . Dizziness and weakness   Immunizations Administered    Name Date Dose VIS Date Route   Pfizer COVID-19 Vaccine 12/31/2019 11:08 AM 0.3 mL 10/16/2018 Intramuscular   Manufacturer: [place]   Lot: [id number]   [place]: [id number]

## 2020-01-06 ENCOUNTER — Other Ambulatory Visit: Payer: Self-pay

## 2020-01-06 ENCOUNTER — Ambulatory Visit
Admission: RE | Admit: 2020-01-06 | Discharge: 2020-01-06 | Disposition: A | Discharge status: Home / Self Care | Payer: Commercial Managed Care - PPO | Source: Ambulatory Visit | Attending: Unknown Physician Specialty | Admitting: Unknown Physician Specialty

## 2020-01-06 DIAGNOSIS — R42 Dizziness and giddiness: Secondary | ICD-10-CM | POA: Diagnosis not present

## 2020-01-21 ENCOUNTER — Other Ambulatory Visit: Payer: Self-pay | Admitting: Neurology

## 2020-01-21 ENCOUNTER — Ambulatory Visit: Payer: Commercial Managed Care - PPO | Attending: Internal Medicine

## 2020-01-21 DIAGNOSIS — Z23 Encounter for immunization: Secondary | ICD-10-CM

## 2020-01-21 DIAGNOSIS — R569 Unspecified convulsions: Secondary | ICD-10-CM

## 2020-01-21 NOTE — Progress Notes (Signed)
   Covid-19 Vaccination Clinic  Name:  Teresa Short    MRN: [id number] DOB: Feb 20, 1966  01/21/2020  [name] was observed post Covid-19 immunization for 15 minutes without incident. She was provided with Vaccine Information Sheet and instruction to access the V-Safe system.   [name] was instructed to call 911 with any severe reactions post vaccine: Marland Kitchen Difficulty breathing  . Swelling of face and throat  . A fast heartbeat  . A bad rash all over body  . Dizziness and weakness   Immunizations Administered    Name Date Dose VIS Date Route   Pfizer COVID-19 Vaccine 01/21/2020  9:18 AM 0.3 mL 10/16/2018 Intramuscular   Manufacturer: [place]   Lot: [id number]   [place]: [id number]

## 2020-02-02 ENCOUNTER — Other Ambulatory Visit: Payer: Self-pay

## 2020-02-02 ENCOUNTER — Ambulatory Visit
Admission: RE | Admit: 2020-02-02 | Discharge: 2020-02-02 | Disposition: A | Discharge status: Home / Self Care | Payer: Commercial Managed Care - PPO | Source: Ambulatory Visit | Attending: Neurology | Admitting: Neurology

## 2020-02-02 DIAGNOSIS — R569 Unspecified convulsions: Secondary | ICD-10-CM | POA: Diagnosis present

## 2020-02-02 MED ORDER — GADOBUTROL 1 MMOL/ML IV SOLN
10.0000 mL | Freq: Once | INTRAVENOUS | Status: AC | PRN
  Administered 2020-02-02: 10 mL via INTRAVENOUS

## 2020-02-20 ENCOUNTER — Encounter: Payer: Commercial Managed Care - PPO | Admitting: Hematology and Oncology

## 2020-02-20 ENCOUNTER — Other Ambulatory Visit: Payer: Commercial Managed Care - PPO

## 2020-02-20 DIAGNOSIS — D72820 Lymphocytosis (symptomatic): Secondary | ICD-10-CM | POA: Insufficient documentation

## 2020-02-20 DIAGNOSIS — E538 Deficiency of other specified B group vitamins: Secondary | ICD-10-CM | POA: Insufficient documentation

## 2020-02-20 DIAGNOSIS — R7989 Other specified abnormal findings of blood chemistry: Secondary | ICD-10-CM | POA: Insufficient documentation

## 2020-02-20 NOTE — Progress Notes (Signed)
[facility]  [address] [place], [place] [address] Phone: [phone]  Fax: [phone]   Clinic Day:  02/21/2020  Referring physician: Lisbeth Ply Ku*  Chief Complaint: Teresa Short is a 54 y.o. female with lymphocytosis who is referred in consultation by Dr. Shelly Bombard for assessment and management.   HPI: The patient presented to Dr. Malvin Johns on 01/30/2020 for an evaluation of her thyroid function. She reported fatigue, feeling cold, cold intolerance, and heat intolerance. Symptoms have been on and off for several years and were worsening. Labs revealed lymphocytosis and vitamin B12 deficiency. She was set up for Vitamin B12 injections and was prescribed Vitamin B12 tablets 1,000 mcg.  Labs followed:  11/29/2016: Hematocrit 37.1, hemoglobin 13.0, platelets 270,000, WBC 12,100 (ANC 6,600). 05/29/2018: Hematocrit 39.5, hemoglobin 13.4, platelets 335,000, WBC 10,600 (ANC 5,560, ALC 3960). 05/30/2019: Hematocrit 39.0, hemoglobin 13.1, platelets 306,000, WBC 11,200 (ANC 6,310; ALC 3510). 01/13/2020: Hematocrit 35.7, hemoglobin 12.0, platelets 333,000, WBC 12,200 (ANC 6,160; ALC 4930).   Additional labs on 01/13/2020 included a B12 of 103 (low), TSH 9.371 (high).  Symptomatically, she notes 5 seizures in April 2021. They all occurred when she coughed too hard. Head CT and MRI were normal. Since initiation of Keppra, she has no energy and sleeps 12-14 hours daily.  She denies any fever or infections. She has not taken any steroids recently or had any joint injections. She takes oral vtamin B12. Her Synthroid dose was recently increased.  The patient has a lot of dizzy spells, especially when she coughs or stands up too fast. She has a cough due to allergies. She reports balance problems when she first stands up. She has a pain in her right arm;  sometimes her arm goes completely numb. The patient denies B symptoms, headaches, vision changes, urinary  symptoms, abdominal symptoms, shortness of breath, chest pain, and skin changes.  She has a history of renal cell carcinoma s/p left partial nephrectomy on 05/21/2014 at [facility].  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  Margins were negative.  Pathologic stage was T1aNx.   She received the [place] COVID-19 vaccines on 12/31/2019 and 01/21/2020.  Her mother had breast cancer at 31. Her maternal aunt had breast and ovarian cancer. Her father had lung cancer and passed away in 11-15-2019.   Past Medical History:  Diagnosis Date  . Anemia   . Cancer [facility]) 11-14-13   Renal cell-tumor removed  . Chickenpox   . Chronic kidney disease   . Gastritis    EROSIVE  . Hyperlipidemia   . Hypertension   . Hypothyroidism   . Migraine   . Polycystic ovary   . Uterine fibroid     Past Surgical History:  Procedure Laterality Date  . APPENDECTOMY  June 2015   Dr Leanora Cover  . CARPAL TUNNEL RELEASE Bilateral 11/15/2010  . COLONOSCOPY WITH PROPOFOL N/A 07/07/2016   Procedure: COLONOSCOPY WITH PROPOFOL;  Surgeon: Lollie Sails, MD;  Location: [facility] ENDOSCOPY;  Service: Endoscopy;  Laterality: N/A;  . DILATION AND CURETTAGE OF UTERUS     hysteroscopy,ablation  . ESOPHAGOGASTRODUODENOSCOPY N/A 02/24/2015   Procedure: ESOPHAGOGASTRODUODENOSCOPY (EGD);  Surgeon: Lollie Sails, MD;  Location: [facility] ENDOSCOPY;  Service: Endoscopy;  Laterality: N/A;  . ESOPHAGOGASTRODUODENOSCOPY (EGD) WITH PROPOFOL N/A 07/07/2016   Procedure: ESOPHAGOGASTRODUODENOSCOPY (EGD) WITH PROPOFOL;  Surgeon: Lollie Sails, MD;  Location: [facility] ENDOSCOPY;  Service: Endoscopy;  Laterality: N/A;  . FLEXOR TENOTOMY Right 05/22/2015  Procedure: right elbow extensor origin repair;  Surgeon: Christophe Louis, MD;  Location: [facility] ORS;  Service: Orthopedics;  Laterality: Right;  . PARTIAL NEPHRECTOMY  Sept 2015   20 % at [facility]  . UTERINE ABLATION      Family History  Problem Relation Age of Onset  . Breast  cancer Mother 46  . Breast cancer Maternal Aunt 98    Social History:  reports that she quit smoking about 2 years ago. Her smoking use included cigarettes. She quit after 25.00 years of use. She has never used smokeless tobacco. She reports current alcohol use. She reports that she does not use drugs. She denies any exposure to radiation or toxins. She does not use tobacco or drugs. She drinks alcohol very rarely. The patient is a retired Scientist, research (physical sciences). The patient is alone today.  Allergies:  Allergies  Allergen Reactions  . Latex Hives    Itching and hives  . Dm-Apap-Cpm Other (See Comments)    Unknown, childhood allergy Unknown, childhood allergy  . Lisinopril Cough  . Other Other (See Comments)    Unknown, childhood allergy    Current Medications: Current Outpatient Medications  Medication Sig Dispense Refill  . cetirizine (ZYRTEC) 10 MG tablet Take 10 mg by mouth at bedtime.     . citalopram (CELEXA) 40 MG tablet Take 40 mg by mouth daily.    Marland Kitchen levETIRAcetam (KEPPRA) 500 MG tablet Take by mouth.    . levothyroxine (SYNTHROID) 112 MCG tablet Take 112 mcg by mouth daily.    Marland Kitchen levothyroxine (SYNTHROID, LEVOTHROID) 75 MCG tablet Take 116 mcg by mouth daily before breakfast.     . losartan (COZAAR) 25 MG tablet Take 25 mg by mouth daily.    . Omega-3 Fatty Acids (FISH OIL) 1000 MG CAPS Take 2 capsules by mouth every morning.    . pantoprazole (PROTONIX) 40 MG tablet Take 40 mg by mouth daily.    . traZODone (DESYREL) 50 MG tablet Take 50 mg by mouth at bedtime.    Marland Kitchen albuterol (PROVENTIL HFA;VENTOLIN HFA) 108 (90 Base) MCG/ACT inhaler Inhale 2 puffs into the lungs every 6 (six) hours as needed for wheezing or shortness of breath. (Patient not taking: Reported on 02/21/2020) 1 Inhaler 2  . fluticasone (FLOVENT HFA) 110 MCG/ACT inhaler Inhale 2 puffs into the lungs 2 (two) times daily. (Patient not taking: Reported on 07/06/2018) 1 Inhaler 2  . sucralfate (CARAFATE) 1 g tablet Take 1  g by mouth 4 (four) times daily -  with meals and at bedtime. (Patient not taking: Reported on 02/21/2020)     No current facility-administered medications for this visit.    Review of Systems  Constitutional: Positive for malaise/fatigue (sleeps 12-14 hrs/day felt secondary to [place]). Negative for chills, fever and weight loss.  HENT: Negative for congestion, ear discharge, ear pain, hearing loss, nosebleeds, sinus pain, sore throat and tinnitus.   Eyes: Negative for blurred vision.  Respiratory: Positive for cough. Negative for hemoptysis, sputum production and shortness of breath.   Cardiovascular: Negative for chest pain, palpitations and leg swelling.  Gastrointestinal: Negative for abdominal pain, blood in stool, constipation, diarrhea, heartburn, melena, nausea and vomiting.  Genitourinary: Negative for dysuria, frequency, hematuria and urgency.  Musculoskeletal: Negative for back pain, joint pain, myalgias and neck pain.       Pain/numbness in right arm  Skin: Negative for itching and rash.  Neurological: Positive for dizziness and seizures (5 episodes over 2 months). Negative for tingling, sensory change,  weakness and headaches.       Balance problems  Endo/Heme/Allergies: Positive for environmental allergies. Does not bruise/bleed easily.  Psychiatric/Behavioral: Negative for depression and memory loss. The patient is not nervous/anxious and does not have insomnia.   All other systems reviewed and are negative.  Performance status (ECOG): 1  Vitals Blood pressure (!) 109/59, pulse (!) 58, temperature (!) 97.3 F (36.3 C), temperature source Tympanic, resp. rate 18, height 5\' 6"  (1.676 m), weight 247 lb 7.5 oz (112.2 kg), SpO2 98 %.   Physical Exam Vitals and nursing note reviewed.  Constitutional:      General: She is not in acute distress.    Appearance: She is not diaphoretic.  HENT:     Head: Normocephalic and atraumatic.  Eyes:     General: No scleral icterus.     Extraocular Movements: Extraocular movements intact.     Conjunctiva/sclera: Conjunctivae normal.     Pupils: Pupils are equal, round, and reactive to light.  Cardiovascular:     Rate and Rhythm: Normal rate and regular rhythm.     Heart sounds: Normal heart sounds.  Pulmonary:     Effort: Pulmonary effort is normal. No respiratory distress.     Breath sounds: Normal breath sounds. No wheezing or rales.  Chest:     Chest wall: No tenderness.  Abdominal:     General: Bowel sounds are normal. There is no distension.     Palpations: Abdomen is soft. There is no hepatomegaly, splenomegaly or mass.     Tenderness: There is no abdominal tenderness. There is no guarding or rebound.  Musculoskeletal:        General: No swelling or tenderness. Normal range of motion.     Cervical back: Normal range of motion and neck supple.  Lymphadenopathy:     Head:     Right side of head: No preauricular, posterior auricular or occipital adenopathy.     Left side of head: No preauricular, posterior auricular or occipital adenopathy.     Cervical: No cervical adenopathy.     Upper Body:     Right upper body: No supraclavicular or axillary adenopathy.     Left upper body: No supraclavicular or axillary adenopathy.     Lower Body: No right inguinal adenopathy. No left inguinal adenopathy.  Skin:    General: Skin is warm and dry.  Neurological:     Mental Status: She is alert and oriented to person, place, and time. Mental status is at baseline.  Psychiatric:        Mood and Affect: Mood normal.        Behavior: Behavior normal.        Thought Content: Thought content normal.        Judgment: Judgment normal.     No visits with results within 3 Day(s) from this visit.  Latest known visit with results is:  Admission on 07/07/2016, Discharged on 07/07/2016  Component Date Value Ref Range Status  . SURGICAL PATHOLOGY 07/07/2016    Final                   Value:Surgical Pathology CASE:  [id number] PATIENT: Teressa Senter Surgical Pathology Report  SPECIMEN SUBMITTED: A. Duodenum; cbx B. Stomach, antrum; cbx C. Stomach, body; cbx D. GEJ; cbx E. Colon, right; cbx F. Colon, left; cbx  CLINICAL HISTORY: None provided  PRE-OPERATIVE DIAGNOSIS: GERD, IRREG bowel habits, diarrhea  POST-OPERATIVE DIAGNOSIS: Irregular GEJ and variant mucosa, diverticulosis  DIAGNOSIS: A. DUODENUM; COLD  BIOPSY: - UNREMARKABLE DUODENAL MUCOSA.  B. STOMACH, ANTRUM; COLD BIOPSY: - ANTRAL MUCOSA WITH REACTIVE FOVEOLAR HYPERPLASIA, CONSISTENT WITH HEALING MUCOSAL INJURY. - NEGATIVE FOR H. PYLORI, DYSPLASIA, AND MALIGNANCY.  C. STOMACH, BODY; COLD BIOPSY: - ANTRAL TYPE MUCOSA WITH GLANDULAR DROPOUT AND INTESTINAL METAPLASIA, CONSISTENT WITH ATROPHIC GASTRITIS. - NEGATIVE FOR H. PYLORI, DYSPLASIA, AND MALIGNANCY.  D. GEJ; COLD BIOPSY: - REFLUX GASTROESOPHAGITIS. - RARE FOCAL INTESTINAL METAPLASIA, CORRELATION WITH ENDOSCOPIC F                         INDINGS IS REQUIRED. - PANCREATIC ACINAR CELL METAPLASIA, NONSPECIFIC. - NEGATIVE FOR DYSPLASIA AND MALIGNANCY.  E. COLON, RIGHT; COLD BIOPSY: - UNREMARKABLE COLONIC MUCOSA.  F. COLON, LEFT; COLD BIOPSY: - UNREMARKABLE COLONIC MUCOSA.   GROSS DESCRIPTION:  A. Labeled: C BX duodenum  Tissue fragment(s): multiple  Size: aggregate, 0.9 x 0.3 x 0.1 cm  Description: tan fragments  Entirely submitted in 1 cassette(s).   B. Labeled: C BX antrum  Tissue fragment(s): 1  Size: 0.4 cm  Description: tan fragment  Entirely submitted in one cassette(s).  C. Labeled: C BX body  Tissue fragment(s): 1  Size: 0.4 cm  Description: tan fragment  Entirely submitted in 1 cassette(s).  D. Labeled: C BX GEJ  Tissue fragment(s): 2  Size: 0.2-0.3 cm  Description: pink-tan fragments  Entirely submitted in 1 cassette(s).  E. Labeled: C BX right colon  Tissue fragment(s): 2 0 point  Size: 2 and 0.4 cm  Description:  tan fragments  Entirely submitted in                          1 cassette(s).  F. Labeled: C BX left colon  Tissue fragment(s): 3  Size: less than 0.1-0.4 cm  Description: tan fragments  Entirely submitted in 1 cassette(s).   Final Diagnosis performed by Quay Burow, MD.  Electronically signed 07/08/2016 1:12:50PM   The electronic signature indicates that the named Attending Pathologist has evaluated the specimen  Technical component performed at [facility], [address] [place], [place] [address] Lab: [phone] Dir: Darrick Penna. Evette Doffing, MD  Professional component performed at [facility], [place], [place], [place] [address] Lab: [phone] Dir: Dellia Nims. Reuel Derby, MD    Assessment:  Teresa Short is a 54 y.o. female with lymphocytosis.  She denies any issues with infections.  CBC on 01/13/2020: hematocrit 35.7, hemoglobin 12.0, platelets 333,000, WBC 12,200 (ANC 6,160; ALC 4930).   She has B12 deficiency.  B12 was 103 (low) on 01/13/2020.  She is on oral B12.  She began having seizures in 11/2019.  Head MRI on 02/02/2020 was normal.  She is on Keppra.  She has a history of stage I renal cell carcinoma s/p left partial nephrectomy on 05/21/2014 at [facility].  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  Margins were negative.  Pathologic stage was T1aNx.  She received the [place] COVID-19 vaccines on 12/31/2019 and 01/21/2020.  She has a family history of breast and ovarian cancer.  Her mother had breast cancer at 56. Her maternal aunt had breast and ovarian cancer.   Symptomatically, she has been fatigued since initiation of Keppra.  She sleeps 12-14 hours/day.  She becomes dizzy with coughing or standing.  Exam reveals no adenopathy or hepatosplenomegaly.  Plan: 1.   Labs today:  CBC with diff, CMP, B12, anti-parietal antibody, intrinsic factor antibody, flow cytometry.  2.   Lymphocytosis  She has mild  leukocytosis with lymphocytes > 3600 (31-35% of WBC).  Suspect etiology is reactive.  Check flow cytometry. 3.   B12 deficiency  B12 was 103 on 01/13/2020.  Patient on oral B12.  Labs to r/o pernicious anemia. 4.  Family history of breast and ovarian cancer  She has not had a mammogram since 2019.  Discuss importance of yearly mammogram. 5.   RTC in 1 week for MD assess and review of work-up.  I discussed the assessment and treatment plan with the patient.  The patient was provided an opportunity to ask questions and all were answered.  The patient agreed with the plan and demonstrated an understanding of the instructions.  The patient was advised to call back if the symptoms worsen or if the condition fails to improve as anticipated.  I provided 23 minutes of face-to-face time during this this encounter and > 50% was spent counseling as documented under my assessment and plan.  An additional 15-20 minutes were spent reviewing her chart (Epic and Care Everywhere) including notes, labs, and imaging studies.    Marifer Hurd C. Mike Gip, MD, PhD    02/21/2020, 11:47 AM  I, Mirian Mo Tufford, am acting as Education administrator for [employer]. Mike Gip, MD, PhD.  I, Arrabella Westerman C. Mike Gip, MD, have reviewed the above documentation for accuracy and completeness, and I agree with the above.

## 2020-02-21 ENCOUNTER — Encounter: Payer: Self-pay | Admitting: Hematology and Oncology

## 2020-02-21 ENCOUNTER — Inpatient Hospital Stay: Payer: Commercial Managed Care - PPO

## 2020-02-21 ENCOUNTER — Other Ambulatory Visit: Payer: Self-pay

## 2020-02-21 ENCOUNTER — Inpatient Hospital Stay: Payer: Commercial Managed Care - PPO | Attending: Hematology and Oncology | Admitting: Hematology and Oncology

## 2020-02-21 VITALS — BP 109/59 | HR 58 | Temp 97.3°F | Resp 18 | Ht 66.0 in | Wt 247.5 lb

## 2020-02-21 DIAGNOSIS — Z87891 Personal history of nicotine dependence: Secondary | ICD-10-CM | POA: Diagnosis not present

## 2020-02-21 DIAGNOSIS — E538 Deficiency of other specified B group vitamins: Secondary | ICD-10-CM | POA: Diagnosis not present

## 2020-02-21 DIAGNOSIS — Z803 Family history of malignant neoplasm of breast: Secondary | ICD-10-CM | POA: Diagnosis not present

## 2020-02-21 DIAGNOSIS — Z8041 Family history of malignant neoplasm of ovary: Secondary | ICD-10-CM | POA: Diagnosis not present

## 2020-02-21 DIAGNOSIS — Z7289 Other problems related to lifestyle: Secondary | ICD-10-CM | POA: Insufficient documentation

## 2020-02-21 DIAGNOSIS — Z801 Family history of malignant neoplasm of trachea, bronchus and lung: Secondary | ICD-10-CM | POA: Insufficient documentation

## 2020-02-21 DIAGNOSIS — D72820 Lymphocytosis (symptomatic): Secondary | ICD-10-CM | POA: Diagnosis present

## 2020-02-21 DIAGNOSIS — Z85528 Personal history of other malignant neoplasm of kidney: Secondary | ICD-10-CM | POA: Insufficient documentation

## 2020-02-21 LAB — CBC WITH DIFFERENTIAL/PLATELET
Abs Immature Granulocytes: 0.04 10*3/uL (ref 0.00–0.07)
Basophils Absolute: 0.1 10*3/uL (ref 0.0–0.1)
Basophils Relative: 1 %
Eosinophils Absolute: 0.2 10*3/uL (ref 0.0–0.5)
Eosinophils Relative: 2 %
HCT: 35 % — ABNORMAL LOW (ref 36.0–46.0)
Hemoglobin: 12 g/dL (ref 12.0–15.0)
Immature Granulocytes: 0 %
Lymphocytes Relative: 33 %
Lymphs Abs: 3.2 10*3/uL (ref 0.7–4.0)
MCH: 29.1 pg (ref 26.0–34.0)
MCHC: 34.3 g/dL (ref 30.0–36.0)
MCV: 84.7 fL (ref 80.0–100.0)
Monocytes Absolute: 0.7 10*3/uL (ref 0.1–1.0)
Monocytes Relative: 7 %
Neutro Abs: 5.6 10*3/uL (ref 1.7–7.7)
Neutrophils Relative %: 57 %
Platelets: 309 10*3/uL (ref 150–400)
RBC: 4.13 MIL/uL (ref 3.87–5.11)
RDW: 14.1 % (ref 11.5–15.5)
WBC: 9.9 10*3/uL (ref 4.0–10.5)
nRBC: 0 % (ref 0.0–0.2)

## 2020-02-21 LAB — COMPREHENSIVE METABOLIC PANEL
ALT: 16 U/L (ref 0–44)
AST: 17 U/L (ref 15–41)
Albumin: 3.9 g/dL (ref 3.5–5.0)
Alkaline Phosphatase: 69 U/L (ref 38–126)
Anion gap: 7 (ref 5–15)
BUN: 11 mg/dL (ref 6–20)
CO2: 25 mmol/L (ref 22–32)
Calcium: 9.1 mg/dL (ref 8.9–10.3)
Chloride: 105 mmol/L (ref 98–111)
Creatinine, Ser: 0.91 mg/dL (ref 0.44–1.00)
GFR calc Af Amer: >60 mL/min (ref >60)
GFR calc non Af Amer: >60 mL/min (ref >60)
Glucose, Bld: 102 mg/dL — ABNORMAL HIGH (ref 70–99)
Potassium: 4.1 mmol/L (ref 3.5–5.1)
Sodium: 137 mmol/L (ref 135–145)
Total Bilirubin: 0.5 mg/dL (ref 0.3–1.2)
Total Protein: 7.2 g/dL (ref 6.5–8.1)

## 2020-02-21 LAB — VITAMIN B12: Vitamin B-12: 278 pg/mL (ref 180–914)

## 2020-02-22 LAB — ANTI-PARIETAL ANTIBODY: Parietal Cell Antibody-IgG: 181.3 Units — ABNORMAL HIGH (ref 0.0–20.0)

## 2020-02-25 ENCOUNTER — Telehealth: Payer: Self-pay

## 2020-02-25 LAB — INTRINSIC FACTOR ANTIBODIES: Intrinsic Factor: 1 AU/mL (ref 0.0–1.1)

## 2020-02-25 NOTE — Telephone Encounter (Signed)
-----   Message from [place], [place] sent at 02/25/2020 12:10 PM EDT ----- Regarding: FW: Please call patient  ----- Message ----- From: Lequita Asal, MD Sent: 02/22/2020  12:16 PM EDT To: Vito Berger, CMA Subject: Please call patient                             If she has been taking oral B12 for a month then she needs to start B12 injections.  She can have injections in our office or with her PCP.  M ----- Message ----- From: Buel Ream, Lab In [place] Sent: 02/21/2020  12:21 PM EDT To: Lequita Asal, MD

## 2020-02-26 LAB — COMP PANEL: LEUKEMIA/LYMPHOMA

## 2020-02-27 ENCOUNTER — Encounter: Payer: Self-pay | Admitting: Hematology and Oncology

## 2020-02-27 NOTE — Progress Notes (Signed)
[facility]  [address] [place], [place] [address] Phone: [phone]  Fax: [phone]   Clinic Day:  02/28/2020  Referring physician: Langley Gauss Primary [facility]*  Chief Complaint: Teresa Short is a 54 y.o. female with lymphocytosis who is seen for review of work-up and discussion regarding direction of therapy  HPI: The patient was last seen in the hematology clinic on 02/21/2020 for new patient assessment. She had a history of seizures since 11/2019 and was on Keppra.  She was diagnosed with B12 deficiency and hypothyroidism in 12/2019. Symptomatically, she felt fatigued since initiation of Keppra.  She slept 12-14 hours/day.  She described becoming dizzy with coughing or standing.  Exam reveals no adenopathy or hepatosplenomegaly.  Work-up included a hematocrit 35.0, hemoglobin 12.0, platelets 309,000, WBC 9,900 (ANC 5600; ALC 3200). CMP was normal. Vitamin B12 was 278.  Intrinsic factor antibody was 1.0 (normal) and anti-parietal cell antibody was 181.3 (0-20) c/w pernicious anemia.   Flow cytometry revealed no significant immunophenotypic abnormality.  There was an absolute increase in T-cells. There was no loss of, or aberrant expression of, the pan T cell antigens to suggest a neoplastic T cell process. CD4:CD8 ratio was 1.7. There were no circulating blasts.  During the interim, she has been "hanging in there." She has been on oral B12 for 2 weeks. She denies any new symptoms or complaints at this time.   Past Medical History:  Diagnosis Date  . Anemia   . Cancer [facility]) 2015   Renal cell-tumor removed  . Chickenpox   . Chronic kidney disease   . Gastritis    EROSIVE  . Hyperlipidemia   . Hypertension   . Hypothyroidism   . Migraine   . Polycystic ovary   . Uterine fibroid     Past Surgical History:  Procedure Laterality Date  . APPENDECTOMY  June 2015   Dr Leanora Cover  . CARPAL TUNNEL RELEASE Bilateral 2012,2013  . COLONOSCOPY WITH  PROPOFOL N/A 07/07/2016   Procedure: COLONOSCOPY WITH PROPOFOL;  Surgeon: Lollie Sails, MD;  Location: [facility] ENDOSCOPY;  Service: Endoscopy;  Laterality: N/A;  . DILATION AND CURETTAGE OF UTERUS     hysteroscopy,ablation  . ESOPHAGOGASTRODUODENOSCOPY N/A 02/24/2015   Procedure: ESOPHAGOGASTRODUODENOSCOPY (EGD);  Surgeon: Lollie Sails, MD;  Location: [facility] ENDOSCOPY;  Service: Endoscopy;  Laterality: N/A;  . ESOPHAGOGASTRODUODENOSCOPY (EGD) WITH PROPOFOL N/A 07/07/2016   Procedure: ESOPHAGOGASTRODUODENOSCOPY (EGD) WITH PROPOFOL;  Surgeon: Lollie Sails, MD;  Location: [facility] ENDOSCOPY;  Service: Endoscopy;  Laterality: N/A;  . FLEXOR TENOTOMY Right 05/22/2015   Procedure: right elbow extensor origin repair;  Surgeon: Christophe Louis, MD;  Location: [facility] ORS;  Service: Orthopedics;  Laterality: Right;  . PARTIAL NEPHRECTOMY  Sept 2015   20 % at [facility]  . UTERINE ABLATION      Family History  Problem Relation Age of Onset  . Breast cancer Mother 54  . Breast cancer Maternal Aunt 92    Social History:  reports that she quit smoking about 2 years ago. Her smoking use included cigarettes. She quit after 25.00 years of use. She has never used smokeless tobacco. She reports current alcohol use. She reports that she does not use drugs.Denies any exposure to radiation or toxins. She does not use tobacco or drugs. She drinks alcohol very rarely. The patient is a retired Scientist, research (physical sciences). The patient is alone today.  Allergies:  Allergies  Allergen Reactions  . Latex Hives    Itching and hives  .  Dm-Apap-Cpm Other (See Comments)    Unknown, childhood allergy Unknown, childhood allergy  . Lisinopril Cough  . Other Other (See Comments)    Unknown, childhood allergy    Current Medications: Current Outpatient Medications  Medication Sig Dispense Refill  . albuterol (PROVENTIL HFA;VENTOLIN HFA) 108 (90 Base) MCG/ACT inhaler Inhale 2 puffs into the lungs every 6 (six) hours as needed for  wheezing or shortness of breath. (Patient not taking: Reported on 02/21/2020) 1 Inhaler 2  . cetirizine (ZYRTEC) 10 MG tablet Take 10 mg by mouth at bedtime.     . citalopram (CELEXA) 40 MG tablet Take 40 mg by mouth daily.    . fluticasone (FLOVENT HFA) 110 MCG/ACT inhaler Inhale 2 puffs into the lungs 2 (two) times daily. (Patient not taking: Reported on 07/06/2018) 1 Inhaler 2  . levETIRAcetam (KEPPRA) 500 MG tablet Take by mouth.    . levothyroxine (SYNTHROID) 112 MCG tablet Take 112 mcg by mouth daily.    Marland Kitchen levothyroxine (SYNTHROID, LEVOTHROID) 75 MCG tablet Take 116 mcg by mouth daily before breakfast.     . losartan (COZAAR) 25 MG tablet Take 25 mg by mouth daily.    . Omega-3 Fatty Acids (FISH OIL) 1000 MG CAPS Take 2 capsules by mouth every morning.    . pantoprazole (PROTONIX) 40 MG tablet Take 40 mg by mouth daily.    . sucralfate (CARAFATE) 1 g tablet Take 1 g by mouth 4 (four) times daily -  with meals and at bedtime. (Patient not taking: Reported on 02/21/2020)    . traZODone (DESYREL) 50 MG tablet Take 50 mg by mouth at bedtime.     No current facility-administered medications for this visit.    Review of Systems  Constitutional: Positive for malaise/fatigue (sleeps 12-14 hrs/day, states it is due to Kepra) and weight loss (6 lbs). Negative for chills, diaphoresis and fever.  HENT: Negative.  Negative for congestion, ear discharge, ear pain, hearing loss, nosebleeds, sinus pain, sore throat and tinnitus.   Eyes: Negative.  Negative for blurred vision.  Respiratory: Positive for cough. Negative for hemoptysis, sputum production and shortness of breath.   Cardiovascular: Negative.  Negative for chest pain, palpitations and leg swelling.  Gastrointestinal: Negative.  Negative for abdominal pain, blood in stool, constipation, diarrhea, heartburn, melena, nausea and vomiting.  Genitourinary: Negative for dysuria, frequency, hematuria and urgency.  Musculoskeletal: Negative for back  pain, joint pain, myalgias and neck pain.       Pain/numbness in right arm  Skin: Negative.  Negative for itching and rash.  Neurological: Positive for dizziness and seizures (5 episodes over 2 months). Negative for tingling, sensory change, weakness and headaches.       Balance problems  Endo/Heme/Allergies: Positive for environmental allergies. Does not bruise/bleed easily.  Psychiatric/Behavioral: Negative.  Negative for depression and memory loss. The patient is not nervous/anxious and does not have insomnia.   All other systems reviewed and are negative.  Performance status (ECOG): 1  Vitals There were no vitals taken for this visit.   Physical Exam Vitals and nursing note reviewed.  Constitutional:      General: She is not in acute distress.    Appearance: She is not diaphoretic.  Eyes:     General: No scleral icterus.    Conjunctiva/sclera: Conjunctivae normal.  Neurological:     Mental Status: She is alert and oriented to person, place, and time. Mental status is at baseline.  Psychiatric:        Behavior: Behavior  normal.        Thought Content: Thought content normal.        Judgment: Judgment normal.    No visits with results within 3 Day(s) from this visit.  Latest known visit with results is:  Appointment on 02/21/2020  Component Date Value Ref Range Status  . PATH INTERP XXX-IMP 02/21/2020 Comment   Final   No significant immunophenotypic abnormality detected  . CLINICAL INFO 02/21/2020 Comment   Corrected   Comment: (NOTE) A recent CBC was not available for review at the time this report was prepared.   Marland Kitchen Specimen Type 02/21/2020 Comment   Final   Peripheral blood  . ASSESSMENT OF LEUKOCYTES 02/21/2020 Comment   Final   Comment: (NOTE) No monoclonal B cell population is detected. kappa:lambda ratio 1.4 An absolute increase in T-cells is detected. There is no loss of, or aberrant expression of, the pan T cell antigens to suggest a neoplastic T cell  process. CD4:CD8 ratio 1.7 No circulating blasts are detected. There is no immunophenotypic  evidence of abnormal myeloid maturation. Analysis of the lymphocyte population shows: B cells 14%, T cells 84%, NK cells 2%   . % Viable Cells 02/21/2020 Comment   Corrected   86%  . ANALYSIS AND GATING STRATEGY 02/21/2020 Comment   Final   8 color analysis with CD45/SSC  . IMMUNOPHENOTYPING STUDY 02/21/2020 Comment   Final   Comment: (NOTE) CD2       Normal         CD3       Normal CD4       Normal         CD5       Normal CD7       Normal         CD8       Normal CD10      Normal         CD11b     Normal CD13      Normal         CD14      Normal CD16      Normal         CD19      Normal CD20      Normal         CD33      Normal CD34      Normal         CD38      Normal CD45      Normal         CD56      Normal CD57      Normal         CD117     Normal HLA-DR    Normal         KAPPA     Normal LAMBDA    Normal         CD64      Normal   . PATHOLOGIST NAME 02/21/2020 Comment   Final   Henrietta Hoover, M.D.  . COMMENT: 02/21/2020 Comment   Corrected   Comment: (NOTE) Each antibody in this assay was utilized to assess for potential abnormalities of studied cell populations or to characterize identified abnormalities. This test was developed and its performance characteristics determined by [facility].  It has not been cleared or approved by the U.S. Food and Drug Administration. The FDA has determined that such clearance or approval is not necessary. This test is used for clinical purposes.  It should not be regarded as investigational or for research. Performed At: -[facility] [address] [place], [place] [id number] Katina Degree MDPhD WU:[id number] Performed At: [facility] [address] [place], [place] [id number] Katina Degree MDPhD VH:[id number]   . Sodium 02/21/2020 137  135 - 145 mmol/L Final  . Potassium 02/21/2020 4.1  3.5 - 5.1 mmol/L Final  . Chloride 02/21/2020  105  98 - 111 mmol/L Final  . CO2 02/21/2020 25  22 - 32 mmol/L Final  . Glucose, Bld 02/21/2020 102* 70 - 99 mg/dL Final   Glucose reference range applies only to samples taken after fasting for at least 8 hours.  . BUN 02/21/2020 11  6 - 20 mg/dL Final  . Creatinine, Ser 02/21/2020 0.91  0.44 - 1.00 mg/dL Final  . Calcium 02/21/2020 9.1  8.9 - 10.3 mg/dL Final  . Total Protein 02/21/2020 7.2  6.5 - 8.1 g/dL Final  . Albumin 02/21/2020 3.9  3.5 - 5.0 g/dL Final  . AST 02/21/2020 17  15 - 41 U/L Final  . ALT 02/21/2020 16  0 - 44 U/L Final  . Alkaline Phosphatase 02/21/2020 69  38 - 126 U/L Final  . Total Bilirubin 02/21/2020 0.5  0.3 - 1.2 mg/dL Final  . GFR calc non Af Amer 02/21/2020 >60  >60 mL/min Final  . GFR calc Af Amer 02/21/2020 >60  >60 mL/min Final  . Anion gap 02/21/2020 7  5 - 15 Final   Performed at [facility], [address], [place], [place] [address]  . Vitamin B-12 02/21/2020 278  180 - 914 pg/mL Final   Comment: (NOTE) This assay is not validated for testing neonatal or myeloproliferative syndrome specimens for Vitamin B12 levels. Performed at [facility], [place] [address], [place], [place] [address]   . Intrinsic Factor 02/21/2020 1.0  0.0 - 1.1 AU/mL Final   Comment: (NOTE) Performed At: [facility] [place], [place] [id number] Rush Farmer MD [id number]   . Parietal Cell Antibody-IgG 02/21/2020 181.3* 0.0 - 20.0 Units Final   Comment: (NOTE)                                Negative    0.0 - 20.0                                Equivocal  20.1 - 24.9                                Positive         >24.9 Parietal Cell Antibodies are found in 90% of patients with pernicious anemia and 30% of first degree relatives with pernicious anemia. Performed At: [facility] [place], [place] [id number] Rush Farmer MD OA:[id number]   . WBC 02/21/2020 9.9  4.0 - 10.5 K/uL Final  . RBC 02/21/2020  4.13  3.87 - 5.11 MIL/uL Final  . Hemoglobin 02/21/2020 12.0  12.0 - 15.0 g/dL Final  . HCT 02/21/2020 35.0* 36 - 46 % Final  . MCV 02/21/2020 84.7  80.0 - 100.0 fL Final  . MCH 02/21/2020 29.1  26.0 - 34.0 pg Final  . MCHC 02/21/2020 34.3  30.0 - 36.0 g/dL Final  . RDW 02/21/2020 14.1  11.5 - 15.5 % Final  . Platelets 02/21/2020 309  150 - 400 K/uL Final  . nRBC 02/21/2020 0.0  0.0 - 0.2 % Final  . Neutrophils Relative % 02/21/2020 57  % Final  . Neutro Abs 02/21/2020 5.6  1.7 - 7.7 K/uL Final  . Lymphocytes Relative 02/21/2020 33  % Final  . Lymphs Abs 02/21/2020 3.2  0.7 - 4.0 K/uL Final  . Monocytes Relative 02/21/2020 7  % Final  . Monocytes Absolute 02/21/2020 0.7  0 - 1 K/uL Final  . Eosinophils Relative 02/21/2020 2  % Final  . Eosinophils Absolute 02/21/2020 0.2  0 - 0 K/uL Final  . Basophils Relative 02/21/2020 1  % Final  . Basophils Absolute 02/21/2020 0.1  0 - 0 K/uL Final  . Immature Granulocytes 02/21/2020 0  % Final  . Abs Immature Granulocytes 02/21/2020 0.04  0.00 - 0.07 K/uL Final   Performed at [facility], [address], [place], [place] [address]    Assessment:  Jeyla Bulger is a 54 y.o. female lymphocytosis.  She denies any issues with infections.  CBC on 01/13/2020: hematocrit 35.7, hemoglobin 12.0, platelets 333,000, WBC 12,200 (ANC 6,160; ALC 4930).   Work-up on 02/21/2020 revealed a hematocrit 35.0, hemoglobin 12.0, platelets 309,000, WBC 9,900 (ANC 5600; ALC 3200). CMP was normal. B12 was 278 (low).  Intrinsic factor antibody was 1.0 (normal) and anti-parietal cell antibody was 181.3 (0-20) c/w pernicious anemia. Flow cytometry revealed no significant immunophenotypic abnormality  She has B12 deficiency.  B12 was 103 (low) on 01/13/2020.  She is on oral B12.  She has pernicious anemia.  She began having seizures in 11/2019.  Head MRI on 02/02/2020 was normal.  She is on Keppra.  She has a history of stage I renal cell carcinoma s/p left  partial nephrectomy on 05/21/2014 at [facility].  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  Margins were negative.  Pathologic stage was T1aNx.  She received the [place] COVID-19 vaccine on 12/31/2019 and 01/21/2020.  She has a family history of breast and ovarian cancer.  Her mother had breast cancer at 18. Her maternal aunt had breast and ovarian cancer.   Symptomatically, she has been fatigued since initiation of Keppra.  She sleeps 12-14 hours/day.  She becomes dizzy with coughing or standing.  Exam reveals no adenopathy or hepatosplenomegaly.  Plan: 1.   Review labs from 02/21/2020. 2.   Lymphocytosis             WBC 9,900 with an ALC 3200.  She has a h/o mild leukocytosis with lymphocytes > 3600 (31-35% WBCs).  Etiology is reactive.  Flow cytometry is normal. 3.   B12 deficiency             B12 was 103 on 01/13/2020.             Patient on oral B12.             Work-up confirms pernicious anemia.  Discuss B12 injections. 4.  Family history of breast and ovarian cancer             She has not had a mammogram since 2019.             Follow-up yearly mammogram. 5.   B12 today and monthly x 6. 6.   RTC in 6 months for MD assessment, labs (CBC with diff, folate), and B12.  I discussed the assessment and treatment plan with the  patient.  The patient was provided an opportunity to ask questions and all were answered.  The patient agreed with the plan and demonstrated an understanding of the instructions.  The patient was advised to call back if the symptoms worsen or if the condition fails to improve as anticipated.   Melissa C. Mike Gip, MD, PhD    02/28/2020, 1:39 PM  I, Mirian Mo Tufford, am acting as Education administrator for [employer]. Mike Gip, MD, PhD.  I, Melissa C. Mike Gip, MD, have reviewed the above documentation for accuracy and completeness, and I agree with the above.

## 2020-02-27 NOTE — Progress Notes (Signed)
The patient c/o frequency, burning with urination x 3 days. The patient also c/o diarrhea x 2 days. The patient Name and DOB has been verified by phone today.

## 2020-02-28 ENCOUNTER — Other Ambulatory Visit: Payer: Self-pay

## 2020-02-28 ENCOUNTER — Inpatient Hospital Stay (HOSPITAL_BASED_OUTPATIENT_CLINIC_OR_DEPARTMENT_OTHER): Payer: Commercial Managed Care - PPO | Admitting: Hematology and Oncology

## 2020-02-28 ENCOUNTER — Inpatient Hospital Stay: Payer: Commercial Managed Care - PPO

## 2020-02-28 ENCOUNTER — Encounter: Payer: Self-pay | Admitting: Hematology and Oncology

## 2020-02-28 VITALS — BP 128/86 | HR 69 | Temp 97.0°F | Wt 241.6 lb

## 2020-02-28 DIAGNOSIS — E538 Deficiency of other specified B group vitamins: Secondary | ICD-10-CM

## 2020-02-28 DIAGNOSIS — D510 Vitamin B12 deficiency anemia due to intrinsic factor deficiency: Secondary | ICD-10-CM

## 2020-02-28 DIAGNOSIS — D72820 Lymphocytosis (symptomatic): Secondary | ICD-10-CM

## 2020-02-28 MED ORDER — CYANOCOBALAMIN 1000 MCG/ML IJ SOLN
1000.0000 ug | Freq: Once | INTRAMUSCULAR | Status: AC
  Administered 2020-02-28: 1000 ug via INTRAMUSCULAR

## 2020-02-29 DIAGNOSIS — Z8041 Family history of malignant neoplasm of ovary: Secondary | ICD-10-CM | POA: Insufficient documentation

## 2020-02-29 DIAGNOSIS — Z85528 Personal history of other malignant neoplasm of kidney: Secondary | ICD-10-CM | POA: Insufficient documentation

## 2020-02-29 DIAGNOSIS — Z803 Family history of malignant neoplasm of breast: Secondary | ICD-10-CM | POA: Insufficient documentation

## 2020-03-30 ENCOUNTER — Inpatient Hospital Stay: Payer: Commercial Managed Care - PPO | Attending: Hematology and Oncology

## 2020-03-30 ENCOUNTER — Other Ambulatory Visit: Payer: Self-pay

## 2020-03-30 DIAGNOSIS — E538 Deficiency of other specified B group vitamins: Secondary | ICD-10-CM | POA: Insufficient documentation

## 2020-03-30 MED ORDER — CYANOCOBALAMIN 1000 MCG/ML IJ SOLN
1000.0000 ug | Freq: Once | INTRAMUSCULAR | Status: AC
  Administered 2020-03-30: 1000 ug via INTRAMUSCULAR

## 2020-04-28 ENCOUNTER — Inpatient Hospital Stay: Payer: Commercial Managed Care - PPO | Attending: Hematology and Oncology

## 2020-04-28 ENCOUNTER — Other Ambulatory Visit: Payer: Self-pay

## 2020-04-28 DIAGNOSIS — E538 Deficiency of other specified B group vitamins: Secondary | ICD-10-CM | POA: Diagnosis present

## 2020-04-28 MED ORDER — CYANOCOBALAMIN 1000 MCG/ML IJ SOLN
1000.0000 ug | Freq: Once | INTRAMUSCULAR | Status: AC
  Administered 2020-04-28: 1000 ug via INTRAMUSCULAR
  Filled 2020-04-28: qty 1

## 2020-05-25 ENCOUNTER — Inpatient Hospital Stay: Payer: Commercial Managed Care - PPO | Attending: Hematology and Oncology

## 2020-05-25 ENCOUNTER — Other Ambulatory Visit: Payer: Self-pay

## 2020-05-25 DIAGNOSIS — E538 Deficiency of other specified B group vitamins: Secondary | ICD-10-CM | POA: Insufficient documentation

## 2020-05-25 MED ORDER — CYANOCOBALAMIN 1000 MCG/ML IJ SOLN
1000.0000 ug | Freq: Once | INTRAMUSCULAR | Status: AC
  Administered 2020-05-25: 1000 ug via INTRAMUSCULAR

## 2020-05-25 MED ORDER — CYANOCOBALAMIN 1000 MCG/ML IJ SOLN
INTRAMUSCULAR | Status: AC
  Filled 2020-05-25: qty 1

## 2020-06-12 ENCOUNTER — Other Ambulatory Visit: Payer: Self-pay | Admitting: Gastroenterology

## 2020-06-12 DIAGNOSIS — K760 Fatty (change of) liver, not elsewhere classified: Secondary | ICD-10-CM

## 2020-06-22 ENCOUNTER — Other Ambulatory Visit: Payer: Self-pay

## 2020-06-22 ENCOUNTER — Inpatient Hospital Stay: Payer: Commercial Managed Care - PPO | Attending: Hematology and Oncology

## 2020-06-22 DIAGNOSIS — E538 Deficiency of other specified B group vitamins: Secondary | ICD-10-CM | POA: Diagnosis not present

## 2020-06-22 MED ORDER — CYANOCOBALAMIN 1000 MCG/ML IJ SOLN
1000.0000 ug | Freq: Once | INTRAMUSCULAR | Status: AC
  Administered 2020-06-22: 1000 ug via INTRAMUSCULAR

## 2020-06-22 MED ORDER — CYANOCOBALAMIN 1000 MCG/ML IJ SOLN
INTRAMUSCULAR | Status: AC
  Filled 2020-06-22: qty 1

## 2020-06-26 ENCOUNTER — Other Ambulatory Visit: Payer: Commercial Managed Care - PPO

## 2020-07-03 ENCOUNTER — Other Ambulatory Visit: Payer: Self-pay

## 2020-07-03 ENCOUNTER — Ambulatory Visit: Payer: Commercial Managed Care - PPO

## 2020-07-03 ENCOUNTER — Ambulatory Visit
Admission: RE | Admit: 2020-07-03 | Discharge: 2020-07-03 | Disposition: A | Discharge status: Home / Self Care | Payer: Commercial Managed Care - PPO | Source: Ambulatory Visit | Attending: Gastroenterology | Admitting: Gastroenterology

## 2020-07-03 DIAGNOSIS — K760 Fatty (change of) liver, not elsewhere classified: Secondary | ICD-10-CM | POA: Diagnosis not present

## 2020-07-10 ENCOUNTER — Other Ambulatory Visit: Payer: Self-pay | Admitting: Gastroenterology

## 2020-07-10 ENCOUNTER — Other Ambulatory Visit (HOSPITAL_COMMUNITY): Payer: Self-pay | Admitting: Gastroenterology

## 2020-07-10 DIAGNOSIS — R1013 Epigastric pain: Secondary | ICD-10-CM

## 2020-07-20 ENCOUNTER — Inpatient Hospital Stay: Payer: Commercial Managed Care - PPO

## 2020-07-20 ENCOUNTER — Other Ambulatory Visit: Payer: Self-pay

## 2020-07-20 DIAGNOSIS — E538 Deficiency of other specified B group vitamins: Secondary | ICD-10-CM

## 2020-07-20 MED ORDER — CYANOCOBALAMIN 1000 MCG/ML IJ SOLN
INTRAMUSCULAR | Status: AC
  Filled 2020-07-20: qty 1

## 2020-07-20 MED ORDER — CYANOCOBALAMIN 1000 MCG/ML IJ SOLN
1000.0000 ug | Freq: Once | INTRAMUSCULAR | Status: AC
  Administered 2020-07-20: 1000 ug via INTRAMUSCULAR

## 2020-07-29 ENCOUNTER — Other Ambulatory Visit
Admission: RE | Admit: 2020-07-29 | Discharge: 2020-07-29 | Disposition: A | Discharge status: Home / Self Care | Payer: Commercial Managed Care - PPO | Source: Ambulatory Visit | Attending: Gastroenterology | Admitting: Gastroenterology

## 2020-07-29 ENCOUNTER — Other Ambulatory Visit: Payer: Self-pay

## 2020-07-29 DIAGNOSIS — Z01812 Encounter for preprocedural laboratory examination: Secondary | ICD-10-CM | POA: Diagnosis present

## 2020-07-29 DIAGNOSIS — Z20822 Contact with and (suspected) exposure to covid-19: Secondary | ICD-10-CM | POA: Insufficient documentation

## 2020-07-29 LAB — SARS CORONAVIRUS 2 (TAT 6-24 HRS): SARS Coronavirus 2: NEGATIVE

## 2020-07-30 ENCOUNTER — Encounter: Payer: Self-pay | Admitting: *Deleted

## 2020-07-31 ENCOUNTER — Encounter
Admission: RE | Disposition: A | Discharge status: Home / Self Care | Payer: Self-pay | Source: Home / Self Care | Attending: Gastroenterology

## 2020-07-31 ENCOUNTER — Ambulatory Visit: Payer: Commercial Managed Care - PPO | Admitting: Certified Registered Nurse Anesthetist

## 2020-07-31 ENCOUNTER — Other Ambulatory Visit: Payer: Self-pay

## 2020-07-31 ENCOUNTER — Encounter: Payer: Self-pay | Admitting: *Deleted

## 2020-07-31 ENCOUNTER — Ambulatory Visit
Admission: RE | Admit: 2020-07-31 | Discharge: 2020-07-31 | Disposition: A | Discharge status: Home / Self Care | Payer: Commercial Managed Care - PPO | Attending: Gastroenterology | Admitting: Gastroenterology

## 2020-07-31 DIAGNOSIS — K2950 Unspecified chronic gastritis without bleeding: Secondary | ICD-10-CM | POA: Diagnosis not present

## 2020-07-31 DIAGNOSIS — K2289 Other specified disease of esophagus: Secondary | ICD-10-CM | POA: Diagnosis not present

## 2020-07-31 DIAGNOSIS — K31A12 Gastric intestinal metaplasia without dysplasia, involving the body (corpus): Secondary | ICD-10-CM | POA: Insufficient documentation

## 2020-07-31 DIAGNOSIS — D510 Vitamin B12 deficiency anemia due to intrinsic factor deficiency: Secondary | ICD-10-CM | POA: Diagnosis present

## 2020-07-31 HISTORY — PX: ESOPHAGOGASTRODUODENOSCOPY: SHX5428

## 2020-07-31 LAB — KOH PREP: KOH Prep: NONE SEEN

## 2020-07-31 SURGERY — EGD (ESOPHAGOGASTRODUODENOSCOPY)
Anesthesia: General

## 2020-07-31 MED ORDER — SODIUM CHLORIDE 0.9 % IV SOLN
INTRAVENOUS | Status: DC
  Administered 2020-07-31: 1000 mL via INTRAVENOUS

## 2020-07-31 MED ORDER — LIDOCAINE HCL (CARDIAC) PF 100 MG/5ML IV SOSY
PREFILLED_SYRINGE | INTRAVENOUS | Status: DC | PRN
  Administered 2020-07-31: 50 mg via INTRAVENOUS

## 2020-07-31 MED ORDER — MIDAZOLAM HCL 2 MG/2ML IJ SOLN
INTRAMUSCULAR | Status: DC | PRN
  Administered 2020-07-31: 2 mg via INTRAVENOUS

## 2020-07-31 MED ORDER — LIDOCAINE HCL (PF) 2 % IJ SOLN
INTRAMUSCULAR | Status: AC
  Filled 2020-07-31: qty 5

## 2020-07-31 MED ORDER — MIDAZOLAM HCL 2 MG/2ML IJ SOLN
INTRAMUSCULAR | Status: AC
  Filled 2020-07-31: qty 2

## 2020-07-31 MED ORDER — PROPOFOL 10 MG/ML IV BOLUS
INTRAVENOUS | Status: DC | PRN
  Administered 2020-07-31: 90 mg via INTRAVENOUS
  Administered 2020-07-31: 22 mg via INTRAVENOUS

## 2020-07-31 MED ORDER — PROPOFOL 500 MG/50ML IV EMUL
INTRAVENOUS | Status: AC
  Filled 2020-07-31: qty 50

## 2020-07-31 MED ORDER — PROPOFOL 500 MG/50ML IV EMUL
INTRAVENOUS | Status: DC | PRN
  Administered 2020-07-31: 140 ug/kg/min via INTRAVENOUS

## 2020-07-31 NOTE — Anesthesia Preprocedure Evaluation (Signed)
Anesthesia Evaluation  Patient identified by MRN, date of birth, ID band Patient awake    Reviewed: Allergy & Precautions, NPO status , Patient's Chart, lab work & pertinent test results  History of Anesthesia Complications Negative for: history of anesthetic complications  Airway Mallampati: II  TM Distance: >3 FB Neck ROM: Full    Dental  (+) Dental Advidsory Given, Teeth Intact, Caps   Pulmonary neg shortness of breath, neg sleep apnea, neg COPD, neg recent URI, former smoker,    breath sounds clear to auscultation- rhonchi (-) wheezing      Cardiovascular Exercise Tolerance: Good hypertension, Pt. on medications (-) angina(-) CAD, (-) Past MI and (-) Cardiac Stents (-) dysrhythmias (-) Valvular Problems/Murmurs Rhythm:Regular Rate:Normal - Systolic murmurs and - Diastolic murmurs    Neuro/Psych  Headaches, Seizures -, Well Controlled,  negative psych ROS   GI/Hepatic negative GI ROS, Neg liver ROS,   Endo/Other  neg diabetesHypothyroidism   Renal/GU CRFRenal disease     Musculoskeletal   Abdominal (+) + obese,   Peds  Hematology  (+) Blood dyscrasia, anemia ,   Anesthesia Other Findings Past Medical History: No date: Anemia 2015: Cancer (HCC)     Comment: Renal cell-tumor removed No date: Chickenpox No date: Chronic kidney disease No date: Gastritis     Comment: EROSIVE No date: Hyperlipidemia No date: Hypertension No date: Hypothyroidism No date: Migraine No date: Polycystic ovary No date: Uterine fibroid   Reproductive/Obstetrics                             Anesthesia Physical  Anesthesia Plan  ASA: II  Anesthesia Plan: General   Post-op Pain Management:    Induction: Intravenous  PONV Risk Score and Plan: 3 and Propofol infusion and TIVA  Airway Management Planned: Natural Airway and Nasal Cannula  Additional Equipment:   Intra-op Plan:   Post-operative  Plan:   Informed Consent: I have reviewed the patients History and Physical, chart, labs and discussed the procedure including the risks, benefits and alternatives for the proposed anesthesia with the patient or authorized representative who has indicated his/her understanding and acceptance.     Dental advisory given  Plan Discussed with: Anesthesiologist and CRNA  Anesthesia Plan Comments:         Anesthesia Quick Evaluation

## 2020-07-31 NOTE — H&P (Signed)
Outpatient short stay form Pre-procedure 07/31/2020 10:29 AM Teresa Miyamoto MD, MPH  Primary Physician: [facility]  Reason for visit:  History of b12 deficiency  History of present illness:   54 y/o lady with history of pernicious anemia here for gastric mapping. No family history of GI malignancies. No blood thinners.    Current Facility-Administered Medications:  .  0.9 %  sodium chloride infusion, , Intravenous, Continuous, Mistina Coatney, Hilton Cork, MD  Medications Prior to Admission  Medication Sig Dispense Refill Last Dose  . cetirizine (ZYRTEC) 10 MG tablet Take 10 mg by mouth at bedtime.    Past Week at Unknown time  . citalopram (CELEXA) 40 MG tablet Take 40 mg by mouth daily.   07/30/2020 at Unknown time  . levETIRAcetam (KEPPRA) 500 MG tablet Take by mouth.   07/30/2020 at Unknown time  . levothyroxine (SYNTHROID) 112 MCG tablet Take 112 mcg by mouth daily.   07/30/2020 at Unknown time  . levothyroxine (SYNTHROID, LEVOTHROID) 75 MCG tablet Take 116 mcg by mouth daily before breakfast.    07/30/2020 at Unknown time  . losartan (COZAAR) 25 MG tablet Take 25 mg by mouth daily.   07/30/2020 at Unknown time  . Omega-3 Fatty Acids (FISH OIL) 1000 MG CAPS Take 2 capsules by mouth every morning.   07/30/2020 at Unknown time  . pantoprazole (PROTONIX) 40 MG tablet Take 40 mg by mouth daily.   07/30/2020 at Unknown time  . traZODone (DESYREL) 50 MG tablet Take 50 mg by mouth at bedtime.   07/30/2020 at Unknown time  . albuterol (PROVENTIL HFA;VENTOLIN HFA) 108 (90 Base) MCG/ACT inhaler Inhale 2 puffs into the lungs every 6 (six) hours as needed for wheezing or shortness of breath. (Patient not taking: Reported on 02/27/2020) 1 Inhaler 2   . fluticasone (FLOVENT HFA) 110 MCG/ACT inhaler Inhale 2 puffs into the lungs 2 (two) times daily. (Patient not taking: Reported on 07/06/2018) 1 Inhaler 2   . sucralfate (CARAFATE) 1 g tablet Take 1 g by mouth 4 (four) times daily -  with meals and at  bedtime. (Patient not taking: Reported on 02/21/2020)        Allergies  Allergen Reactions  . Latex Hives    Itching and hives  . Dm-Apap-Cpm Other (See Comments)    Unknown, childhood allergy Unknown, childhood allergy  . Lisinopril Cough  . Other Other (See Comments)    Unknown, childhood allergy     Past Medical History:  Diagnosis Date  . Anemia   . Cancer [facility]) 2015   Renal cell-tumor removed  . Chickenpox   . Chronic kidney disease   . Gastritis    EROSIVE  . Hyperlipidemia   . Hypertension   . Hypothyroidism   . Migraine   . Polycystic ovary   . Uterine fibroid     Review of systems:  Otherwise negative.    Physical Exam  Gen: Alert, oriented. Appears stated age.  HEENT:PERRLA. Lungs: No respiratory distress CV: RRR Abd: soft, benign, no masses Ext: No edema.     Planned procedures: Proceed with EGD. The patient understands the nature of the planned procedure, indications, risks, alternatives and potential complications including but not limited to bleeding, infection, perforation, damage to internal organs and possible oversedation/side effects from anesthesia. The patient agrees and gives consent to proceed.  Please refer to procedure notes for findings, recommendations and patient disposition/instructions.     Teresa Miyamoto MD, MPH Gastroenterology 07/31/2020  10:29 AM

## 2020-07-31 NOTE — Anesthesia Postprocedure Evaluation (Signed)
Anesthesia Post Note  Patient: Teresa Short  Procedure(s) Performed: ESOPHAGOGASTRODUODENOSCOPY (EGD) (N/A )  Patient location during evaluation: Endoscopy Anesthesia Type: General Level of consciousness: awake and alert Pain management: pain level controlled Vital Signs Assessment: post-procedure vital signs reviewed and stable Respiratory status: spontaneous breathing, nonlabored ventilation, respiratory function stable and patient connected to nasal cannula oxygen Cardiovascular status: blood pressure returned to baseline and stable Postop Assessment: no apparent nausea or vomiting Anesthetic complications: no   No complications documented.   Last Vitals:  Vitals:   07/31/20 1118 07/31/20 1126  BP: (!) 110/58 127/81  Pulse: 60 (!) 57  Resp: 20 12  Temp:    SpO2: 95% 96%    Last Pain:  Vitals:   07/31/20 1126  TempSrc:   PainSc: 0-No pain                 Martha Clan

## 2020-07-31 NOTE — Op Note (Addendum)
[facility] Patient Name: Teresa Short Procedure Date: 07/31/2020 10:31 AM MRN: [id number] Account #: [id number] Date of Birth: [birth year] Admit Type: Outpatient Age: 54 Room: [facility] ENDO ROOM 2 Gender: Female Note Status: Supervisor Override Procedure:             Upper GI endoscopy Indications:           Dyspepsia, Pernicious Anemia Providers:             Andrey Farmer MD, MD Referring MD:          [facility] (Referring MD) Medicines:             Monitored Anesthesia Care Complications:         No immediate complications. Estimated blood loss:                         Minimal. Procedure:             Pre-Anesthesia Assessment:                        - Prior to the procedure, a History and Physical was                         performed, and patient medications and allergies were                         reviewed. The patient is competent. The risks and                         benefits of the procedure and the sedation options and                         risks were discussed with the patient. All questions                         were answered and informed consent was obtained.                         Patient identification and proposed procedure were                         verified by the physician, the nurse, the anesthetist                         and the technician in the endoscopy suite. Mental                         Status Examination: alert and oriented. Airway                         Examination: normal oropharyngeal airway and neck                         mobility. Respiratory Examination: clear to                         auscultation. CV Examination: normal. Prophylactic  Antibiotics: The patient does not require prophylactic                         antibiotics. Prior Anticoagulants: The patient has                         taken no previous anticoagulant or antiplatelet                          agents. ASA Grade Assessment: III - A patient with                         severe systemic disease. After reviewing the risks and                         benefits, the patient was deemed in satisfactory                         condition to undergo the procedure. The anesthesia                         plan was to use monitored anesthesia care (MAC).                         Immediately prior to administration of medications,                         the patient was re-assessed for adequacy to receive                         sedatives. The heart rate, respiratory rate, oxygen                         saturations, blood pressure, adequacy of pulmonary                         ventilation, and response to care were monitored                         throughout the procedure. The physical status of the                         patient was re-assessed after the procedure.                        After obtaining informed consent, the endoscope was                         passed under direct vision. Throughout the procedure,                         the patient's blood pressure, pulse, and oxygen                         saturations were monitored continuously. The Endoscope                         was introduced through the mouth, and advanced to the  second part of duodenum. The upper GI endoscopy was                         accomplished without difficulty. The patient tolerated                         the procedure well. Findings:      White nummular lesions were noted in the entire esophagus. Brushings for       KOH prep were obtained in the lower third of the esophagus. Estimated       blood loss: none.      A medium amount of food (residue) was found in the gastric body and in       the gastric antrum.      Biopsies were taken with a cold forceps on the greater curvature of the       gastric body, on the lesser curvature of the gastric body, at the       incisura, on the  greater curvature of the gastric antrum and on the       lesser curvature of the gastric antrum for histology. Estimated blood       loss was minimal.      Food (residue) was found in the duodenal bulb.      The exam of the duodenum was otherwise normal. Impression:            - White nummular lesions in esophageal mucosa.                         Brushings performed.                        - A medium amount of food (residue) in the stomach.                        - Retained food in the duodenum.                        - Biopsies were taken with a cold forceps for                         histology on the greater curvature of the gastric                         body, on the lesser curvature of the gastric body, at                         the incisura, on the greater curvature of the gastric                         antrum and on the lesser curvature of the gastric                         antrum. Recommendation:        - Discharge patient to home.                        - Resume previous diet.                        -  Continue present medications.                        - Await pathology results.                        - Return to referring physician as previously                         scheduled. Procedure Code(s):     --- Professional ---                        [phone], Esophagogastroduodenoscopy, flexible,                         transoral; with biopsy, single or multiple Diagnosis Code(s):     --- Professional ---                        K22.8, Other specified diseases of esophagus                        R10.13, Epigastric pain CPT copyright 2019 American Medical Association. All rights reserved. The codes documented in this report are preliminary and upon coder review may  be revised to meet current compliance requirements. Andrey Farmer, MD Andrey Farmer MD, MD 07/31/2020 10:58:47 AM Number of Addenda: 0 Note Initiated On: 07/31/2020 10:31 AM Estimated Blood Loss:  Estimated  blood loss was minimal.      [facility]

## 2020-07-31 NOTE — Transfer of Care (Signed)
Immediate Anesthesia Transfer of Care Note  Patient: Teresa Short  Procedure(s) Performed: ESOPHAGOGASTRODUODENOSCOPY (EGD) (N/A )  Patient Location: PACU and Endoscopy Unit  Anesthesia Type:General  Level of Consciousness: drowsy  Airway & Oxygen Therapy: Patient Spontanous Breathing and Patient connected to nasal cannula oxygen  Post-op Assessment: Report given to RN and Post -op Vital signs reviewed and stable  Post vital signs: Reviewed and stable  Last Vitals:  Vitals Value Taken Time  BP 115/74 07/31/20 1058  Temp 36.4 C 07/31/20 1058  Pulse 67 07/31/20 1100  Resp 23 07/31/20 1100  SpO2 91 % 07/31/20 1100  Vitals shown include unvalidated device data.  Last Pain:  Vitals:   07/31/20 1058  TempSrc: Temporal  PainSc: Asleep         Complications: No complications documented.

## 2020-08-03 ENCOUNTER — Other Ambulatory Visit: Payer: Self-pay | Admitting: Gastroenterology

## 2020-08-03 ENCOUNTER — Encounter: Payer: Self-pay | Admitting: Gastroenterology

## 2020-08-03 DIAGNOSIS — R1013 Epigastric pain: Secondary | ICD-10-CM

## 2020-08-04 LAB — SURGICAL PATHOLOGY

## 2020-08-17 NOTE — Progress Notes (Signed)
[facility]  [address], [place] [address] Phone: [phone]  Fax: [phone]   Clinic Day: 08/18/20  Referring physician: [facility]  Chief Complaint: Teresa Short is a 54 y.o. female with lymphocytosis who is seen for 6 month assessment.  HPI: The patient was last seen in the hematology clinic on 02/28/2020. At that time, she had been fatigued since initiation of Keppra.  She slept 12-14 hours/day.  She became dizzy with coughing or standing.  Exam revealed no adenopathy or hepatosplenomegaly.  Hematocrit was 35.0, hemoglobin 12.0, MCV 84.7, platelets 309,000, WBC 9900 with an ANC of 5600 (ALC 3200).  She received vitamin B12 injections monthly x 6 (02/28/2020 - 07/20/2020).  Complete abdomen ultrasound on 07/03/2020 revealed fatty infiltration of the liver. Gallbladder was decompressed without cholelithiasis.  EGD on 07/31/2020 by Dr. Haig Prophet revealed white nummular lesions in esophageal mucosa. There was a medium amount of food (residue) in the stomach. There was retained food in the duodenum. Stomach biopsies revealed chronic gastritis with intestinal metaplasia negative for H. pylori, dysplasia, and malignancy.  The patient is scheduled for gastric emptying study on 09/25/2020 and HIDA scan on 09/18/2020.  During the interim, she has been "hanging in there." She reports low energy. Her energy improves a couple of weeks after she gets her vitamin B12 injection. She also reports abdominal pain after eating and takes Tums and Prilosec for heartburn. She still has pain/numbness in her right arm. She still gets dizzy if she is walking and changes direction too quickly.  The patient is taking Keppra and is still having seizures. She has had 5 seizures in the past 2 weeks. She thought that the seizures were due to coughing fits when she breathed in her husband's cigarette smoke. However, on Saturday, she had a seizure without  being around cigarette smoke. She has not missed any doses of Keppra. She is not seeing neurology regularly. Her neurologist is Dr. Manuella Ghazi.   Past Medical History:  Diagnosis Date  . Anemia   . Cancer [facility]) 2015   Renal cell-tumor removed  . Chickenpox   . Chronic kidney disease   . Gastritis    EROSIVE  . Hyperlipidemia   . Hypertension   . Hypothyroidism   . Migraine   . Polycystic ovary   . Uterine fibroid     Past Surgical History:  Procedure Laterality Date  . APPENDECTOMY  June 2015   Dr Leanora Cover  . CARPAL TUNNEL RELEASE Bilateral 2012,2013  . COLONOSCOPY WITH PROPOFOL N/A 07/07/2016   Procedure: COLONOSCOPY WITH PROPOFOL;  Surgeon: Lollie Sails, MD;  Location: [facility] ENDOSCOPY;  Service: Endoscopy;  Laterality: N/A;  . DILATION AND CURETTAGE OF UTERUS     hysteroscopy,ablation  . ESOPHAGOGASTRODUODENOSCOPY N/A 02/24/2015   Procedure: ESOPHAGOGASTRODUODENOSCOPY (EGD);  Surgeon: Lollie Sails, MD;  Location: [facility] ENDOSCOPY;  Service: Endoscopy;  Laterality: N/A;  . ESOPHAGOGASTRODUODENOSCOPY N/A 07/31/2020   Procedure: ESOPHAGOGASTRODUODENOSCOPY (EGD);  Surgeon: Lesly Rubenstein, MD;  Location: [facility] ENDOSCOPY;  Service: Endoscopy;  Laterality: N/A;  . ESOPHAGOGASTRODUODENOSCOPY (EGD) WITH PROPOFOL N/A 07/07/2016   Procedure: ESOPHAGOGASTRODUODENOSCOPY (EGD) WITH PROPOFOL;  Surgeon: Lollie Sails, MD;  Location: [facility] ENDOSCOPY;  Service: Endoscopy;  Laterality: N/A;  . FLEXOR TENOTOMY Right 05/22/2015   Procedure: right elbow extensor origin repair;  Surgeon: Christophe Louis, MD;  Location: [facility] ORS;  Service: Orthopedics;  Laterality: Right;  . PARTIAL NEPHRECTOMY  Sept 2015   20 % at [facility]  . UTERINE  ABLATION      Family History  Problem Relation Age of Onset  . Breast cancer Mother 78  . Breast cancer Maternal Aunt 94    Social History:  reports that she quit smoking about 2 years ago. Her smoking use included cigarettes. She quit after 25.00 years of  use. She has never used smokeless tobacco. She reports current alcohol use. She reports that she does not use drugs.Denies any exposure to radiation or toxins. She does not use tobacco or drugs. She drinks alcohol very rarely. The patient is a retired Scientist, research (physical sciences). The patient is alone today.  Allergies:  Allergies  Allergen Reactions  . Latex Hives    Itching and hives  . Dm-Apap-Cpm Other (See Comments)    Unknown, childhood allergy Unknown, childhood allergy  . Lisinopril Cough  . Other Other (See Comments)    Unknown, childhood allergy    Current Medications: Current Outpatient Medications  Medication Sig Dispense Refill  . cetirizine (ZYRTEC) 10 MG tablet Take 10 mg by mouth at bedtime.     . citalopram (CELEXA) 40 MG tablet Take 40 mg by mouth daily.    Marland Kitchen levETIRAcetam (KEPPRA) 500 MG tablet Take by mouth.    . levothyroxine (SYNTHROID) 112 MCG tablet Take 112 mcg by mouth daily.    Marland Kitchen levothyroxine (SYNTHROID, LEVOTHROID) 75 MCG tablet Take 116 mcg by mouth daily before breakfast.     . losartan (COZAAR) 25 MG tablet Take 25 mg by mouth daily.    . Omega-3 Fatty Acids (FISH OIL) 1000 MG CAPS Take 2 capsules by mouth every morning.    Marland Kitchen omeprazole (PRILOSEC) 20 MG capsule Take by mouth.    . sucralfate (CARAFATE) 1 g tablet Take 1 g by mouth 4 (four) times daily -  with meals and at bedtime.    . traZODone (DESYREL) 50 MG tablet Take 50 mg by mouth at bedtime.    Marland Kitchen albuterol (PROVENTIL HFA;VENTOLIN HFA) 108 (90 Base) MCG/ACT inhaler Inhale 2 puffs into the lungs every 6 (six) hours as needed for wheezing or shortness of breath. (Patient not taking: No sig reported) 1 Inhaler 2  . fluticasone (FLOVENT HFA) 110 MCG/ACT inhaler Inhale 2 puffs into the lungs 2 (two) times daily. (Patient not taking: No sig reported) 1 Inhaler 2  . pantoprazole (PROTONIX) 40 MG tablet Take 40 mg by mouth daily. (Patient not taking: Reported on 08/18/2020)     No current facility-administered  medications for this visit.    Review of Systems  Constitutional: Positive for malaise/fatigue. Negative for chills, diaphoresis, fever and weight loss (up 14 lbs).       No energy.  HENT: Negative.  Negative for congestion, ear discharge, ear pain, hearing loss, nosebleeds, sinus pain, sore throat and tinnitus.   Eyes: Negative.  Negative for blurred vision.  Respiratory: Negative.  Negative for cough, hemoptysis, sputum production and shortness of breath.   Cardiovascular: Negative.  Negative for chest pain, palpitations and leg swelling.  Gastrointestinal: Positive for abdominal pain and heartburn (takes Tums and Prilosec). Negative for blood in stool, constipation, diarrhea, melena, nausea and vomiting.  Genitourinary: Negative.  Negative for dysuria, frequency, hematuria and urgency.  Musculoskeletal: Positive for myalgias (pain/numbness in her right arm). Negative for back pain, joint pain and neck pain.  Skin: Negative.  Negative for itching and rash.  Neurological: Positive for dizziness (with quick changes of direction) and seizures (5 episodes in the past 2 weeks). Negative for tingling, sensory change, weakness  and headaches.  Endo/Heme/Allergies: Positive for environmental allergies. Does not bruise/bleed easily.  Psychiatric/Behavioral: Negative.  Negative for depression and memory loss. The patient is not nervous/anxious and does not have insomnia.   All other systems reviewed and are negative.  Performance status (ECOG): 1  Vitals Blood pressure (!) 149/86, pulse 62, temperature (!) 97.4 F (36.3 C), temperature source Tympanic, resp. rate 16, weight 255 lb 2.9 oz (115.8 kg), SpO2 99 %.   Physical Exam Vitals and nursing note reviewed.  Constitutional:      General: She is not in acute distress.    Appearance: She is not diaphoretic.  HENT:     Head: Normocephalic and atraumatic.     Comments: Brown hair pulled up.    Mouth/Throat:     Mouth: Mucous membranes are  moist.     Pharynx: Oropharynx is clear.  Eyes:     General: No scleral icterus.    Extraocular Movements: Extraocular movements intact.     Conjunctiva/sclera: Conjunctivae normal.     Pupils: Pupils are equal, round, and reactive to light.     Comments: Brown eyes.  Cardiovascular:     Rate and Rhythm: Normal rate and regular rhythm.     Heart sounds: Normal heart sounds. No murmur heard.   Pulmonary:     Effort: Pulmonary effort is normal. No respiratory distress.     Breath sounds: Normal breath sounds. No wheezing or rales.  Chest:     Chest wall: No tenderness.  Breasts:     Right: No axillary adenopathy or supraclavicular adenopathy.     Left: No axillary adenopathy or supraclavicular adenopathy.    Abdominal:     General: Bowel sounds are normal. There is no distension.     Palpations: Abdomen is soft. There is no mass.     Tenderness: There is abdominal tenderness (middle abdomen). There is no guarding or rebound.  Musculoskeletal:        General: No swelling or tenderness. Normal range of motion.     Cervical back: Normal range of motion and neck supple.  Lymphadenopathy:     Head:     Right side of head: No preauricular, posterior auricular or occipital adenopathy.     Left side of head: No preauricular, posterior auricular or occipital adenopathy.     Cervical: No cervical adenopathy.     Upper Body:     Right upper body: No supraclavicular or axillary adenopathy.     Left upper body: No supraclavicular or axillary adenopathy.     Lower Body: No right inguinal adenopathy. No left inguinal adenopathy.  Skin:    General: Skin is warm and dry.  Neurological:     Mental Status: She is alert and oriented to person, place, and time. Mental status is at baseline.  Psychiatric:        Behavior: Behavior normal.        Thought Content: Thought content normal.        Judgment: Judgment normal.    Appointment on 08/18/2020  Component Date Value Ref Range Status  .  Folate 08/18/2020 17.4  >5.9 ng/mL Final   Performed at [facility], [address] [place], [place] [address]  . WBC 08/18/2020 10.6* 4.0 - 10.5 K/uL Final  . RBC 08/18/2020 4.26  3.87 - 5.11 MIL/uL Final  . Hemoglobin 08/18/2020 12.1  12.0 - 15.0 g/dL Final  . HCT 08/18/2020 36.2  36.0 - 46.0 % Final  . MCV 08/18/2020 85.0  80.0 - 100.0 fL Final  . MCH 08/18/2020 28.4  26.0 - 34.0 pg Final  . MCHC 08/18/2020 33.4  30.0 - 36.0 g/dL Final  . RDW 08/18/2020 13.5  11.5 - 15.5 % Final  . Platelets 08/18/2020 337  150 - 400 K/uL Final  . nRBC 08/18/2020 0.0  0.0 - 0.2 % Final  . Neutrophils Relative % 08/18/2020 56  % Final  . Neutro Abs 08/18/2020 6.0  1.7 - 7.7 K/uL Final  . Lymphocytes Relative 08/18/2020 34  % Final  . Lymphs Abs 08/18/2020 3.6  0.7 - 4.0 K/uL Final  . Monocytes Relative 08/18/2020 6  % Final  . Monocytes Absolute 08/18/2020 0.6  0.1 - 1.0 K/uL Final  . Eosinophils Relative 08/18/2020 3  % Final  . Eosinophils Absolute 08/18/2020 0.3  0.0 - 0.5 K/uL Final  . Basophils Relative 08/18/2020 1  % Final  . Basophils Absolute 08/18/2020 0.1  0.0 - 0.1 K/uL Final  . Immature Granulocytes 08/18/2020 0  % Final  . Abs Immature Granulocytes 08/18/2020 0.03  0.00 - 0.07 K/uL Final   Performed at [facility], [address], [place], [place] [address]    Assessment:  Teresa Short is a 54 y.o. female lymphocytosis.  She denies any issues with infections.  CBC on 01/13/2020: hematocrit 35.7, hemoglobin 12.0, platelets 333,000, WBC 12,200 (ANC 6,160; ALC 4930).   Work-up on 02/21/2020 revealed a hematocrit 35.0, hemoglobin 12.0, platelets 309,000, WBC 9,900 (ANC 5600; ALC 3200). CMP was normal. B12 was 278 (low).  Intrinsic factor antibody was 1.0 (normal) and anti-parietal cell antibody was 181.3 (0-20) c/w pernicious anemia. Flow cytometry revealed no significant immunophenotypic abnormality  She has B12 deficiency.  B12 was 103 (low) on 01/13/2020.   She was on oral B12.  She receives B12 injections monthly (last 07/20/2020).  She has pernicious anemia.  She began having seizures in 11/2019.  Head MRI on 02/02/2020 was normal.  She is on Keppra.  She has a history of stage I renal cell carcinoma s/p left partial nephrectomy on 05/21/2014 at [facility].  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  Margins were negative.  Pathologic stage was T1aNx.  Abdominal ultrasound on 07/03/2020 revealed fatty infiltration of the liver. Gallbladder was decompressed without cholelithiasis. EGD on 07/31/2020 revealed white nummular lesions in esophageal mucosa. There was a medium amount of food (residue) in the stomach. There was retained food in the duodenum. Stomach biopsies revealed chronic gastritis with intestinal metaplasia negative for H. pylori, dysplasia, and malignancy.  She received the [place] COVID-19 vaccine on 12/31/2019 and 01/21/2020.  She has a family history of breast and ovarian cancer.  Her mother had breast cancer at 62. Her maternal aunt had breast and ovarian cancer.   Symptomatically, she describes low energy. Her energy improves a couple of weeks after she receives her vitamin B12 injection. She also reports abdominal pain after eating and takes Tums and Prilosec for heartburn. She has pain/numbness in her right arm. She had 5 seizures in the past 2 weeks.  Exam reveals no adenopathy or hepatosplenomegly.  Plan: 1.   Labs today: CBC with diff, folate. 2.   Lymphocytosis, resolved             WBC 10,600 with an ALC 3600.  Flow cytometry was normal.  Etiology is felt reactive. 3.   B12 deficiency             B12 was 103  on 01/13/2020.             She has pernicious anemia.  Continue B12 injections monthly (due today).  Folate 17.4 today.  Check folate annually. 4.  Family history of breast and ovarian cancer             She has not had a mammogram since 2019.             Encourage yearly mammogram. 5.    Reschedule mammogram. 6.   B12 today and monthly x 12. 7.   RTC in 6 months for labs (CBC with diff). 8.   RTC in 12 months for MD assessment, labs (CBC with diff, folate), and B12.  I discussed the assessment and treatment plan with the patient.  The patient was provided an opportunity to ask questions and all were answered.  The patient agreed with the plan and demonstrated an understanding of the instructions.  The patient was advised to call back if the symptoms worsen or if the condition fails to improve as anticipated.   Jasmin Winberry C. Mike Gip, MD, PhD    08/18/2020, 5:00 PM  I, Mirian Mo Tufford, am acting as Education administrator for [employer]. Mike Gip, MD, PhD.  I, Clairissa Valvano C. Mike Gip, MD, have reviewed the above documentation for accuracy and completeness, and I agree with the above.

## 2020-08-18 ENCOUNTER — Inpatient Hospital Stay: Payer: Commercial Managed Care - PPO

## 2020-08-18 ENCOUNTER — Encounter: Payer: Self-pay | Admitting: Hematology and Oncology

## 2020-08-18 ENCOUNTER — Other Ambulatory Visit: Payer: Self-pay

## 2020-08-18 ENCOUNTER — Inpatient Hospital Stay: Payer: Commercial Managed Care - PPO | Attending: Hematology and Oncology | Admitting: Hematology and Oncology

## 2020-08-18 VITALS — BP 149/86 | HR 62 | Temp 97.4°F | Resp 16 | Wt 255.2 lb

## 2020-08-18 DIAGNOSIS — D72820 Lymphocytosis (symptomatic): Secondary | ICD-10-CM | POA: Diagnosis not present

## 2020-08-18 DIAGNOSIS — E538 Deficiency of other specified B group vitamins: Secondary | ICD-10-CM

## 2020-08-18 DIAGNOSIS — Z8041 Family history of malignant neoplasm of ovary: Secondary | ICD-10-CM | POA: Diagnosis not present

## 2020-08-18 DIAGNOSIS — Z803 Family history of malignant neoplasm of breast: Secondary | ICD-10-CM | POA: Diagnosis not present

## 2020-08-18 LAB — CBC WITH DIFFERENTIAL/PLATELET
Abs Immature Granulocytes: 0.03 10*3/uL (ref 0.00–0.07)
Basophils Absolute: 0.1 10*3/uL (ref 0.0–0.1)
Basophils Relative: 1 %
Eosinophils Absolute: 0.3 10*3/uL (ref 0.0–0.5)
Eosinophils Relative: 3 %
HCT: 36.2 % (ref 36.0–46.0)
Hemoglobin: 12.1 g/dL (ref 12.0–15.0)
Immature Granulocytes: 0 %
Lymphocytes Relative: 34 %
Lymphs Abs: 3.6 10*3/uL (ref 0.7–4.0)
MCH: 28.4 pg (ref 26.0–34.0)
MCHC: 33.4 g/dL (ref 30.0–36.0)
MCV: 85 fL (ref 80.0–100.0)
Monocytes Absolute: 0.6 10*3/uL (ref 0.1–1.0)
Monocytes Relative: 6 %
Neutro Abs: 6 10*3/uL (ref 1.7–7.7)
Neutrophils Relative %: 56 %
Platelets: 337 10*3/uL (ref 150–400)
RBC: 4.26 MIL/uL (ref 3.87–5.11)
RDW: 13.5 % (ref 11.5–15.5)
WBC: 10.6 10*3/uL — ABNORMAL HIGH (ref 4.0–10.5)
nRBC: 0 % (ref 0.0–0.2)

## 2020-08-18 LAB — FOLATE: Folate: 17.4 ng/mL (ref >5.9)

## 2020-08-18 MED ORDER — CYANOCOBALAMIN 1000 MCG/ML IJ SOLN
1000.0000 ug | Freq: Once | INTRAMUSCULAR | Status: AC
  Administered 2020-08-18: 15:00:00 1000 ug via INTRAMUSCULAR

## 2020-08-18 MED ORDER — CYANOCOBALAMIN 1000 MCG/ML IJ SOLN
INTRAMUSCULAR | Status: AC
  Filled 2020-08-18: qty 1

## 2020-08-18 NOTE — Progress Notes (Signed)
Patient states she had an endoscopy procedure 2 weeks ago and since then she has had 5 seizures. 2 of them was major seizures.

## 2020-08-19 LAB — VITAMIN B12: Vitamin B-12: 861 pg/mL (ref 180–914)

## 2020-09-15 ENCOUNTER — Other Ambulatory Visit: Payer: Self-pay

## 2020-09-15 ENCOUNTER — Inpatient Hospital Stay: Payer: Commercial Managed Care - PPO | Attending: Hematology and Oncology

## 2020-09-15 DIAGNOSIS — Z79899 Other long term (current) drug therapy: Secondary | ICD-10-CM | POA: Insufficient documentation

## 2020-09-15 DIAGNOSIS — E538 Deficiency of other specified B group vitamins: Secondary | ICD-10-CM | POA: Diagnosis not present

## 2020-09-15 MED ORDER — CYANOCOBALAMIN 1000 MCG/ML IJ SOLN
1000.0000 ug | Freq: Once | INTRAMUSCULAR | Status: AC
  Administered 2020-09-15: 1000 ug via INTRAMUSCULAR
  Filled 2020-09-15: qty 1

## 2020-09-18 ENCOUNTER — Other Ambulatory Visit: Payer: Self-pay

## 2020-09-18 ENCOUNTER — Ambulatory Visit
Admission: RE | Admit: 2020-09-18 | Discharge: 2020-09-18 | Disposition: A | Discharge status: Home / Self Care | Payer: Commercial Managed Care - PPO | Source: Ambulatory Visit | Attending: Gastroenterology | Admitting: Gastroenterology

## 2020-09-18 DIAGNOSIS — R1013 Epigastric pain: Secondary | ICD-10-CM | POA: Insufficient documentation

## 2020-09-18 MED ORDER — TECHNETIUM TC 99M MEBROFENIN IV KIT
5.1400 | PACK | Freq: Once | INTRAVENOUS | Status: AC | PRN
  Administered 2020-09-18: 5.14 via INTRAVENOUS

## 2020-09-25 ENCOUNTER — Other Ambulatory Visit: Payer: Self-pay

## 2020-09-25 ENCOUNTER — Ambulatory Visit
Admission: RE | Admit: 2020-09-25 | Discharge: 2020-09-25 | Disposition: A | Discharge status: Home / Self Care | Payer: Commercial Managed Care - PPO | Source: Ambulatory Visit | Attending: Gastroenterology | Admitting: Gastroenterology

## 2020-09-25 DIAGNOSIS — R1013 Epigastric pain: Secondary | ICD-10-CM | POA: Insufficient documentation

## 2020-09-25 MED ORDER — TECHNETIUM TC 99M SULFUR COLLOID
2.0000 | Freq: Once | INTRAVENOUS | Status: AC | PRN
  Administered 2020-09-25: 2.52 via ORAL

## 2020-10-13 ENCOUNTER — Inpatient Hospital Stay: Payer: Commercial Managed Care - PPO | Attending: Hematology and Oncology

## 2020-10-13 ENCOUNTER — Other Ambulatory Visit: Payer: Self-pay

## 2020-10-13 DIAGNOSIS — E538 Deficiency of other specified B group vitamins: Secondary | ICD-10-CM | POA: Diagnosis present

## 2020-10-13 MED ORDER — CYANOCOBALAMIN 1000 MCG/ML IJ SOLN
1000.0000 ug | Freq: Once | INTRAMUSCULAR | Status: AC
  Administered 2020-10-13: 1000 ug via INTRAMUSCULAR
  Filled 2020-10-13: qty 1

## 2020-11-10 ENCOUNTER — Inpatient Hospital Stay: Payer: Commercial Managed Care - PPO | Attending: Hematology and Oncology

## 2020-12-08 ENCOUNTER — Other Ambulatory Visit: Payer: Self-pay

## 2020-12-08 ENCOUNTER — Inpatient Hospital Stay: Payer: Commercial Managed Care - PPO | Attending: Hematology and Oncology

## 2020-12-08 DIAGNOSIS — E538 Deficiency of other specified B group vitamins: Secondary | ICD-10-CM | POA: Diagnosis not present

## 2020-12-08 MED ORDER — CYANOCOBALAMIN 1000 MCG/ML IJ SOLN
1000.0000 ug | Freq: Once | INTRAMUSCULAR | Status: AC
  Administered 2020-12-08: 1000 ug via INTRAMUSCULAR
  Filled 2020-12-08: qty 1

## 2021-01-05 ENCOUNTER — Other Ambulatory Visit: Payer: Self-pay

## 2021-01-05 ENCOUNTER — Inpatient Hospital Stay: Payer: Commercial Managed Care - PPO | Attending: Hematology and Oncology

## 2021-01-05 DIAGNOSIS — E538 Deficiency of other specified B group vitamins: Secondary | ICD-10-CM | POA: Diagnosis present

## 2021-01-05 MED ORDER — CYANOCOBALAMIN 1000 MCG/ML IJ SOLN
1000.0000 ug | Freq: Once | INTRAMUSCULAR | Status: AC
  Administered 2021-01-05: 1000 ug via INTRAMUSCULAR
  Filled 2021-01-05: qty 1

## 2021-02-01 ENCOUNTER — Other Ambulatory Visit: Payer: Self-pay | Admitting: Family Medicine

## 2021-02-01 DIAGNOSIS — Z1231 Encounter for screening mammogram for malignant neoplasm of breast: Secondary | ICD-10-CM

## 2021-02-02 ENCOUNTER — Inpatient Hospital Stay: Payer: Commercial Managed Care - PPO

## 2021-02-02 ENCOUNTER — Other Ambulatory Visit: Payer: Self-pay

## 2021-02-02 ENCOUNTER — Other Ambulatory Visit: Payer: Commercial Managed Care - PPO

## 2021-02-02 DIAGNOSIS — E538 Deficiency of other specified B group vitamins: Secondary | ICD-10-CM

## 2021-02-04 ENCOUNTER — Inpatient Hospital Stay: Payer: Commercial Managed Care - PPO | Attending: Oncology

## 2021-02-04 ENCOUNTER — Other Ambulatory Visit: Payer: Self-pay

## 2021-02-04 ENCOUNTER — Inpatient Hospital Stay: Payer: Commercial Managed Care - PPO

## 2021-02-04 DIAGNOSIS — E538 Deficiency of other specified B group vitamins: Secondary | ICD-10-CM | POA: Insufficient documentation

## 2021-02-04 LAB — CBC WITH DIFFERENTIAL/PLATELET
Abs Immature Granulocytes: 0.03 10*3/uL (ref 0.00–0.07)
Basophils Absolute: 0.1 10*3/uL (ref 0.0–0.1)
Basophils Relative: 0 %
Eosinophils Absolute: 0.2 10*3/uL (ref 0.0–0.5)
Eosinophils Relative: 2 %
HCT: 33.8 % — ABNORMAL LOW (ref 36.0–46.0)
Hemoglobin: 11.4 g/dL — ABNORMAL LOW (ref 12.0–15.0)
Immature Granulocytes: 0 %
Lymphocytes Relative: 32 %
Lymphs Abs: 3.6 10*3/uL (ref 0.7–4.0)
MCH: 27.7 pg (ref 26.0–34.0)
MCHC: 33.7 g/dL (ref 30.0–36.0)
MCV: 82.2 fL (ref 80.0–100.0)
Monocytes Absolute: 0.6 10*3/uL (ref 0.1–1.0)
Monocytes Relative: 5 %
Neutro Abs: 6.7 10*3/uL (ref 1.7–7.7)
Neutrophils Relative %: 61 %
Platelets: 321 10*3/uL (ref 150–400)
RBC: 4.11 MIL/uL (ref 3.87–5.11)
RDW: 14.6 % (ref 11.5–15.5)
WBC: 11.2 10*3/uL — ABNORMAL HIGH (ref 4.0–10.5)
nRBC: 0 % (ref 0.0–0.2)

## 2021-02-04 MED ORDER — CYANOCOBALAMIN 1000 MCG/ML IJ SOLN
1000.0000 ug | Freq: Once | INTRAMUSCULAR | Status: AC
  Administered 2021-02-04: 09:00:00 1000 ug via INTRAMUSCULAR

## 2021-03-02 ENCOUNTER — Other Ambulatory Visit: Payer: Self-pay

## 2021-03-02 ENCOUNTER — Inpatient Hospital Stay: Payer: Commercial Managed Care - PPO | Attending: Internal Medicine

## 2021-03-02 DIAGNOSIS — E538 Deficiency of other specified B group vitamins: Secondary | ICD-10-CM

## 2021-03-02 MED ORDER — CYANOCOBALAMIN 1000 MCG/ML IJ SOLN
1000.0000 ug | Freq: Once | INTRAMUSCULAR | Status: AC
  Administered 2021-03-02: 1000 ug via INTRAMUSCULAR

## 2021-03-08 ENCOUNTER — Ambulatory Visit
Admission: RE | Admit: 2021-03-08 | Discharge: 2021-03-08 | Disposition: A | Discharge status: Home / Self Care | Payer: Commercial Managed Care - PPO | Source: Ambulatory Visit | Attending: Family Medicine | Admitting: Family Medicine

## 2021-03-08 ENCOUNTER — Other Ambulatory Visit: Payer: Self-pay

## 2021-03-08 DIAGNOSIS — Z1231 Encounter for screening mammogram for malignant neoplasm of breast: Secondary | ICD-10-CM | POA: Diagnosis present

## 2021-03-30 ENCOUNTER — Inpatient Hospital Stay: Payer: Commercial Managed Care - PPO | Attending: Hematology and Oncology

## 2021-04-27 ENCOUNTER — Inpatient Hospital Stay: Payer: Commercial Managed Care - PPO | Attending: Hematology and Oncology

## 2021-05-25 ENCOUNTER — Inpatient Hospital Stay: Payer: Commercial Managed Care - PPO | Attending: Hematology and Oncology

## 2021-05-25 ENCOUNTER — Other Ambulatory Visit: Payer: Self-pay

## 2021-05-25 DIAGNOSIS — E538 Deficiency of other specified B group vitamins: Secondary | ICD-10-CM | POA: Diagnosis not present

## 2021-05-25 MED ORDER — CYANOCOBALAMIN 1000 MCG/ML IJ SOLN
1000.0000 ug | Freq: Once | INTRAMUSCULAR | Status: AC
  Administered 2021-05-25: 1000 ug via INTRAMUSCULAR

## 2021-06-22 ENCOUNTER — Other Ambulatory Visit: Payer: Self-pay

## 2021-06-22 ENCOUNTER — Inpatient Hospital Stay: Payer: Commercial Managed Care - PPO | Attending: Hematology and Oncology

## 2021-06-22 VITALS — BP 137/90 | HR 78 | Resp 18

## 2021-06-22 DIAGNOSIS — E538 Deficiency of other specified B group vitamins: Secondary | ICD-10-CM | POA: Diagnosis present

## 2021-06-22 MED ORDER — CYANOCOBALAMIN 1000 MCG/ML IJ SOLN
1000.0000 ug | Freq: Once | INTRAMUSCULAR | Status: AC
  Administered 2021-06-22: 1000 ug via INTRAMUSCULAR
  Filled 2021-06-22: qty 1

## 2021-07-20 ENCOUNTER — Other Ambulatory Visit: Payer: Self-pay

## 2021-07-20 ENCOUNTER — Inpatient Hospital Stay: Payer: Commercial Managed Care - PPO

## 2021-07-20 DIAGNOSIS — E538 Deficiency of other specified B group vitamins: Secondary | ICD-10-CM | POA: Diagnosis not present

## 2021-07-20 MED ORDER — CYANOCOBALAMIN 1000 MCG/ML IJ SOLN
1000.0000 ug | Freq: Once | INTRAMUSCULAR | Status: AC
  Administered 2021-07-20: 1000 ug via INTRAMUSCULAR

## 2021-08-11 ENCOUNTER — Other Ambulatory Visit: Payer: Self-pay | Admitting: *Deleted

## 2021-08-11 DIAGNOSIS — E538 Deficiency of other specified B group vitamins: Secondary | ICD-10-CM

## 2021-08-11 DIAGNOSIS — D72820 Lymphocytosis (symptomatic): Secondary | ICD-10-CM

## 2021-08-18 ENCOUNTER — Ambulatory Visit: Payer: Commercial Managed Care - PPO | Admitting: Oncology

## 2021-08-18 ENCOUNTER — Inpatient Hospital Stay: Payer: Commercial Managed Care - PPO | Attending: Oncology

## 2021-08-18 ENCOUNTER — Other Ambulatory Visit: Payer: Commercial Managed Care - PPO

## 2021-08-18 ENCOUNTER — Other Ambulatory Visit: Payer: Self-pay

## 2021-08-18 ENCOUNTER — Inpatient Hospital Stay (HOSPITAL_BASED_OUTPATIENT_CLINIC_OR_DEPARTMENT_OTHER): Payer: Commercial Managed Care - PPO | Admitting: Oncology

## 2021-08-18 ENCOUNTER — Inpatient Hospital Stay: Payer: Commercial Managed Care - PPO

## 2021-08-18 ENCOUNTER — Ambulatory Visit: Payer: Commercial Managed Care - PPO

## 2021-08-18 ENCOUNTER — Encounter: Payer: Self-pay | Admitting: Oncology

## 2021-08-18 VITALS — BP 114/94 | HR 77 | Temp 98.4°F | Resp 16 | Wt 247.6 lb

## 2021-08-18 DIAGNOSIS — Z803 Family history of malignant neoplasm of breast: Secondary | ICD-10-CM | POA: Diagnosis not present

## 2021-08-18 DIAGNOSIS — Z8041 Family history of malignant neoplasm of ovary: Secondary | ICD-10-CM | POA: Diagnosis not present

## 2021-08-18 DIAGNOSIS — E538 Deficiency of other specified B group vitamins: Secondary | ICD-10-CM | POA: Diagnosis not present

## 2021-08-18 DIAGNOSIS — Z85528 Personal history of other malignant neoplasm of kidney: Secondary | ICD-10-CM | POA: Diagnosis not present

## 2021-08-18 DIAGNOSIS — Z905 Acquired absence of kidney: Secondary | ICD-10-CM | POA: Insufficient documentation

## 2021-08-18 DIAGNOSIS — D72820 Lymphocytosis (symptomatic): Secondary | ICD-10-CM | POA: Insufficient documentation

## 2021-08-18 DIAGNOSIS — Z79899 Other long term (current) drug therapy: Secondary | ICD-10-CM | POA: Diagnosis not present

## 2021-08-18 DIAGNOSIS — R569 Unspecified convulsions: Secondary | ICD-10-CM | POA: Diagnosis not present

## 2021-08-18 DIAGNOSIS — D510 Vitamin B12 deficiency anemia due to intrinsic factor deficiency: Secondary | ICD-10-CM | POA: Insufficient documentation

## 2021-08-18 LAB — CBC WITH DIFFERENTIAL/PLATELET
Abs Immature Granulocytes: 0.05 10*3/uL (ref 0.00–0.07)
Basophils Absolute: 0.1 10*3/uL (ref 0.0–0.1)
Basophils Relative: 1 %
Eosinophils Absolute: 0.2 10*3/uL (ref 0.0–0.5)
Eosinophils Relative: 2 %
HCT: 35.4 % — ABNORMAL LOW (ref 36.0–46.0)
Hemoglobin: 11.9 g/dL — ABNORMAL LOW (ref 12.0–15.0)
Immature Granulocytes: 0 %
Lymphocytes Relative: 27 %
Lymphs Abs: 3.8 10*3/uL (ref 0.7–4.0)
MCH: 28 pg (ref 26.0–34.0)
MCHC: 33.6 g/dL (ref 30.0–36.0)
MCV: 83.3 fL (ref 80.0–100.0)
Monocytes Absolute: 0.9 10*3/uL (ref 0.1–1.0)
Monocytes Relative: 6 %
Neutro Abs: 9.2 10*3/uL — ABNORMAL HIGH (ref 1.7–7.7)
Neutrophils Relative %: 64 %
Platelets: 321 10*3/uL (ref 150–400)
RBC: 4.25 MIL/uL (ref 3.87–5.11)
RDW: 14.8 % (ref 11.5–15.5)
WBC: 14.3 10*3/uL — ABNORMAL HIGH (ref 4.0–10.5)
nRBC: 0 % (ref 0.0–0.2)

## 2021-08-18 LAB — VITAMIN B12: Vitamin B-12: 1404 pg/mL — ABNORMAL HIGH (ref 180–914)

## 2021-08-18 LAB — FOLATE: Folate: 31 ng/mL (ref >5.9)

## 2021-08-18 MED ORDER — CYANOCOBALAMIN 1000 MCG/ML IJ SOLN
1000.0000 ug | Freq: Once | INTRAMUSCULAR | Status: AC
  Administered 2021-08-18: 15:00:00 1000 ug via INTRAMUSCULAR
  Filled 2021-08-18: qty 1

## 2021-08-18 NOTE — Progress Notes (Signed)
Patient here for follow up she reports that she "feels great" no questions or concerns.

## 2021-08-18 NOTE — Progress Notes (Signed)
[facility]  [address], [address] [place], [place] [address] Phone: [phone]  Fax: [phone]   Clinic Day: 08/18/21  Referring physician: Langley Gauss Primary Ca*  Chief Complaint: Teresa Short is a 55 y.o. female with lymphocytosis who is seen for 6 month assessment.  HPI: [name] is a 55 y.o. female lymphocytosis.  She denies any issues with infections.   CBC on 01/13/2020: hematocrit 35.7, hemoglobin 12.0, platelets 333,000, WBC 12,200 (ANC 6,160; ALC 4930).   Work-up on 02/21/2020 revealed a hematocrit 35.0, hemoglobin 12.0, platelets 309,000, WBC 9,900 (ANC 5600; ALC 3200). CMP was normal. B12 was 278 (low).  Intrinsic factor antibody was 1.0 (normal) and anti-parietal cell antibody was 181.3 (0-20) c/w pernicious anemia. Flow cytometry revealed no significant immunophenotypic abnormality   She has B12 deficiency.  B12 was 103 (low) on 01/13/2020.  She was on oral B12.  She receives B12 injections monthly (last 07/20/2020).  She has pernicious anemia.   She began having seizures in 11/2019.  Head MRI on 02/02/2020 was normal.  She is on Keppra.   She has a history of stage I renal cell carcinoma s/p left partial nephrectomy on 05/21/2014 at [facility].  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  Margins were negative.  Pathologic stage was T1aNx.   Abdominal ultrasound on 07/03/2020 revealed fatty infiltration of the liver. Gallbladder was decompressed without cholelithiasis. EGD on 07/31/2020 revealed white nummular lesions in esophageal mucosa. There was a medium amount of food (residue) in the stomach. There was retained food in the duodenum. Stomach biopsies revealed chronic gastritis with intestinal metaplasia negative for H. pylori, dysplasia, and malignancy.  She received the [place] COVID-19 vaccine on 12/31/2019 and 01/21/2020.   She has a family history of breast and ovarian cancer.  Her mother had breast cancer  at 13. Her maternal aunt had breast and ovarian cancer.  Interval history-patient was last seen in clinic over a year ago by Dr. Mike Gip. Reports doing much better then last year. Seizures are now under control and she has not had one in over 6 months. She is followed closely by neurology. Denies any new concerns. No B symptoms. Has an occasional hot flash but believes she is going though menopause. No recent infections.    Past Medical History:  Diagnosis Date   Anemia    Cancer ([place]) 2015   Renal cell-tumor removed   Chickenpox    Chronic kidney disease    Gastritis    EROSIVE   Hyperlipidemia    Hypertension    Hypothyroidism    Migraine    Polycystic ovary    Uterine fibroid     Past Surgical History:  Procedure Laterality Date   APPENDECTOMY  June 2015   Dr Alesia Morin RELEASE Bilateral [id number]   COLONOSCOPY WITH PROPOFOL N/A 07/07/2016   Procedure: COLONOSCOPY WITH PROPOFOL;  Surgeon: Lollie Sails, MD;  Location: [facility] ENDOSCOPY;  Service: Endoscopy;  Laterality: N/A;   DILATION AND CURETTAGE OF UTERUS     hysteroscopy,ablation   ESOPHAGOGASTRODUODENOSCOPY N/A 02/24/2015   Procedure: ESOPHAGOGASTRODUODENOSCOPY (EGD);  Surgeon: Lollie Sails, MD;  Location: [facility] ENDOSCOPY;  Service: Endoscopy;  Laterality: N/A;   ESOPHAGOGASTRODUODENOSCOPY N/A 07/31/2020   Procedure: ESOPHAGOGASTRODUODENOSCOPY (EGD);  Surgeon: Lesly Rubenstein, MD;  Location: [facility] ENDOSCOPY;  Service: Endoscopy;  Laterality: N/A;   ESOPHAGOGASTRODUODENOSCOPY (EGD) WITH PROPOFOL N/A 07/07/2016   Procedure: ESOPHAGOGASTRODUODENOSCOPY (EGD) WITH PROPOFOL;  Surgeon: Lollie Sails, MD;  Location: [facility] ENDOSCOPY;  Service: Endoscopy;  Laterality: N/A;   FLEXOR TENOTOMY  Right 05/22/2015   Procedure: right elbow extensor origin repair;  Surgeon: Christophe Louis, MD;  Location: [facility] ORS;  Service: Orthopedics;  Laterality: Right;   PARTIAL NEPHRECTOMY  Sept 2015   20 % at [place]      Family History  Problem Relation Age of Onset   Breast cancer Mother 16   Breast cancer Maternal Aunt 65    Social History:  reports that she quit smoking about 3 years ago. Her smoking use included cigarettes. She has never used smokeless tobacco. She reports current alcohol use. She reports that she does not use drugs.Denies any exposure to radiation or toxins. She does not use tobacco or drugs. She drinks alcohol very rarely. The patient is a retired Scientist, research (physical sciences). The patient is alone today.  Allergies:  Allergies  Allergen Reactions   Latex Hives    Itching and hives   Dm-Apap-Cpm Other (See Comments)    Unknown, childhood allergy Unknown, childhood allergy   Lisinopril Cough   Other Other (See Comments)    Unknown, childhood allergy    Current Medications: Current Outpatient Medications  Medication Sig Dispense Refill   albuterol (PROVENTIL HFA;VENTOLIN HFA) 108 (90 Base) MCG/ACT inhaler Inhale 2 puffs into the lungs every 6 (six) hours as needed for wheezing or shortness of breath. (Patient not taking: No sig reported) 1 Inhaler 2   cetirizine (ZYRTEC) 10 MG tablet Take 10 mg by mouth at bedtime.      citalopram (CELEXA) 40 MG tablet Take 40 mg by mouth daily.     fluticasone (FLOVENT HFA) 110 MCG/ACT inhaler Inhale 2 puffs into the lungs 2 (two) times daily. (Patient not taking: No sig reported) 1 Inhaler 2   levETIRAcetam (KEPPRA) 500 MG tablet Take by mouth.     levothyroxine (SYNTHROID) 112 MCG tablet Take 112 mcg by mouth daily.     levothyroxine (SYNTHROID, LEVOTHROID) 75 MCG tablet Take 116 mcg by mouth daily before breakfast.      losartan (COZAAR) 25 MG tablet Take 25 mg by mouth daily.     Omega-3 Fatty Acids (FISH OIL) 1000 MG CAPS Take 2 capsules by mouth every morning.     omeprazole (PRILOSEC) 20 MG capsule Take by mouth.     pantoprazole (PROTONIX) 40 MG tablet Take 40 mg by mouth daily. (Patient not taking: Reported on 08/18/2020)      sucralfate (CARAFATE) 1 g tablet Take 1 g by mouth 4 (four) times daily -  with meals and at bedtime.     traZODone (DESYREL) 50 MG tablet Take 50 mg by mouth at bedtime.     No current facility-administered medications for this visit.    Review of Systems  Constitutional: Negative.  Negative for chills, fever, malaise/fatigue and weight loss.  HENT:  Negative for congestion, ear pain and tinnitus.   Eyes: Negative.  Negative for blurred vision and double vision.  Respiratory: Negative.  Negative for cough, sputum production and shortness of breath.   Cardiovascular: Negative.  Negative for chest pain, palpitations and leg swelling.  Gastrointestinal: Negative.  Negative for abdominal pain, constipation, diarrhea, nausea and vomiting.  Genitourinary:  Negative for dysuria, frequency and urgency.  Musculoskeletal:  Negative for back pain and falls.  Skin: Negative.  Negative for rash.  Neurological: Negative.  Negative for weakness and headaches.  Endo/Heme/Allergies: Negative.  Does not bruise/bleed easily.  Occasional hot flash.   Psychiatric/Behavioral: Negative.  Negative for depression. The patient is not nervous/anxious and does not have insomnia.   Performance status (ECOG): 1  Vitals There were no vitals taken for this visit.   Physical Exam Constitutional:      Appearance: She is well-developed. She is obese.  HENT:     Head: Normocephalic and atraumatic.  Eyes:     Pupils: Pupils are equal, round, and reactive to light.  Cardiovascular:     Rate and Rhythm: Normal rate and regular rhythm.     Heart sounds: No murmur heard. Pulmonary:     Effort: Pulmonary effort is normal.     Breath sounds: Normal breath sounds. No wheezing.  Abdominal:     General: Bowel sounds are normal. There is no distension.     Palpations: Abdomen is soft. There is no mass.     Tenderness: There is no abdominal tenderness.  Musculoskeletal:        General: Normal range of motion.      Cervical back: Normal range of motion.  Skin:    General: Skin is warm and dry.  Neurological:     Mental Status: She is alert and oriented to person, place, and time.  Psychiatric:        Behavior: Behavior normal.   No visits with results within 3 Day(s) from this visit.  Latest known visit with results is:  Appointment on 02/04/2021  Component Date Value Ref Range Status   WBC 02/04/2021 11.2 (H)  4.0 - 10.5 K/uL Final   RBC 02/04/2021 4.11  3.87 - 5.11 MIL/uL Final   Hemoglobin 02/04/2021 11.4 (L)  12.0 - 15.0 g/dL Final   HCT 02/04/2021 33.8 (L)  36.0 - 46.0 % Final   MCV 02/04/2021 82.2  80.0 - 100.0 fL Final   MCH 02/04/2021 27.7  26.0 - 34.0 pg Final   MCHC 02/04/2021 33.7  30.0 - 36.0 g/dL Final   RDW 02/04/2021 14.6  11.5 - 15.5 % Final   Platelets 02/04/2021 321  150 - 400 K/uL Final   nRBC 02/04/2021 0.0  0.0 - 0.2 % Final   Neutrophils Relative % 02/04/2021 61  % Final   Neutro Abs 02/04/2021 6.7  1.7 - 7.7 K/uL Final   Lymphocytes Relative 02/04/2021 32  % Final   Lymphs Abs 02/04/2021 3.6  0.7 - 4.0 K/uL Final   Monocytes Relative 02/04/2021 5  % Final   Monocytes Absolute 02/04/2021 0.6  0.1 - 1.0 K/uL Final   Eosinophils Relative 02/04/2021 2  % Final   Eosinophils Absolute 02/04/2021 0.2  0.0 - 0.5 K/uL Final   Basophils Relative 02/04/2021 0  % Final   Basophils Absolute 02/04/2021 0.1  0.0 - 0.1 K/uL Final   Immature Granulocytes 02/04/2021 0  % Final   Abs Immature Granulocytes 02/04/2021 0.03  0.00 - 0.07 K/uL Final   Performed at [facility], [address], [place], [place] [address]    Assessment:   Mrs. Liao is here today for follow-up for her lymphocytosis and B12 deficiency.  Reactive lymphocytosis -clinically she appears to be doing well. No recent infections. Labs from today are essentially stable.  Previous work-up included a flow cytometry which was normal.  Pernicious anemia- Receives monthly B12 injections. She is due for  1 today.    Family history of breast cancer-mammogram from 03/08/2021 was negative.  Seizures-Followed by neurology. Doing well.   Disposition- Return to clinic in 6  months with lab work (CBC with differential) and in 12 months with labs (CBC with differential, folate, B12 level) and MD assessment.   She will need a B12 injection monthly.  I spent 25 minutes dedicated to the care of this patient (face-to-face and non-face-to-face) on the date of the encounter to include what is described in the assessment and plan.  Faythe Casa, NP 08/18/2021 2:24 PM

## 2021-08-19 ENCOUNTER — Encounter: Payer: Self-pay | Admitting: Hematology and Oncology

## 2021-09-20 ENCOUNTER — Inpatient Hospital Stay: Payer: Commercial Managed Care - PPO | Attending: Nurse Practitioner

## 2021-09-20 ENCOUNTER — Other Ambulatory Visit: Payer: Self-pay

## 2021-09-20 DIAGNOSIS — E538 Deficiency of other specified B group vitamins: Secondary | ICD-10-CM | POA: Diagnosis present

## 2021-09-20 MED ORDER — CYANOCOBALAMIN 1000 MCG/ML IJ SOLN
1000.0000 ug | Freq: Once | INTRAMUSCULAR | Status: AC
  Administered 2021-09-20: 1000 ug via INTRAMUSCULAR
  Filled 2021-09-20: qty 1

## 2021-10-05 ENCOUNTER — Other Ambulatory Visit: Payer: Self-pay

## 2021-10-05 ENCOUNTER — Ambulatory Visit: Payer: Commercial Managed Care - PPO | Attending: Neurology

## 2021-10-05 DIAGNOSIS — M5481 Occipital neuralgia: Secondary | ICD-10-CM | POA: Insufficient documentation

## 2021-10-05 DIAGNOSIS — M542 Cervicalgia: Secondary | ICD-10-CM | POA: Insufficient documentation

## 2021-10-05 NOTE — Therapy (Deleted)
[place] [address]. [place], [place], [address] Phone: [phone]   Fax:  [phone]  Physical Therapy Evaluation  Patient Details  Name: Teresa Short MRN: [id number] Date of Birth: [birth year] Referring Provider (Short): Dr. Maudry Diego   Encounter Date: 10/05/2021   Short End of Session - 10/05/21 1057     Visit Number 1    Number of Visits 13    Date for Short Re-Evaluation 11/16/21    Authorization Type eval: 10/05/21    Short Start Time 1100    Short Stop Time 1145    Short Time Calculation (min) 45 min    Activity Tolerance Patient tolerated treatment well    Behavior During Therapy Teresa Short for tasks assessed/performed             Past Medical History:  Diagnosis Date   Anemia    Cancer ([place]) 2015   Renal cell-tumor removed   Chickenpox    Chronic kidney disease    Gastritis    EROSIVE   Hyperlipidemia    Hypertension    Hypothyroidism    Migraine    Polycystic ovary    Uterine fibroid     Past Surgical History:  Procedure Laterality Date   APPENDECTOMY  June 2015   Dr Alesia Morin RELEASE Bilateral [id number]   COLONOSCOPY WITH PROPOFOL N/A 07/07/2016   Procedure: COLONOSCOPY WITH PROPOFOL;  Surgeon: Lollie Sails, MD;  Location: [facility] ENDOSCOPY;  Service: Endoscopy;  Laterality: N/A;   DILATION AND CURETTAGE OF UTERUS     hysteroscopy,ablation   ESOPHAGOGASTRODUODENOSCOPY N/A 02/24/2015   Procedure: ESOPHAGOGASTRODUODENOSCOPY (EGD);  Surgeon: Lollie Sails, MD;  Location: [facility] ENDOSCOPY;  Service: Endoscopy;  Laterality: N/A;   ESOPHAGOGASTRODUODENOSCOPY N/A 07/31/2020   Procedure: ESOPHAGOGASTRODUODENOSCOPY (EGD);  Surgeon: Lesly Rubenstein, MD;  Location: [facility] ENDOSCOPY;  Service: Endoscopy;  Laterality: N/A;   ESOPHAGOGASTRODUODENOSCOPY (EGD) WITH PROPOFOL N/A 07/07/2016   Procedure: ESOPHAGOGASTRODUODENOSCOPY (EGD) WITH PROPOFOL;  Surgeon: Lollie Sails, MD;  Location: [facility] ENDOSCOPY;  Service:  Endoscopy;  Laterality: N/A;   FLEXOR TENOTOMY  Right 05/22/2015   Procedure: right elbow extensor origin repair;  Surgeon: Christophe Louis, MD;  Location: [facility];  Service: Orthopedics;  Laterality: Right;   PARTIAL NEPHRECTOMY  Sept 2015   20 % at [facility]      There were no vitals filed for this visit.    Teresa Short Short Assessment - 10/05/21 1344       Assessment   Medical Diagnosis Occipital Neuralgia    Referring Provider (Short) Dr. Maudry Diego    Onset Date/Surgical Date --   ~2 years ago   Hand Dominance Right    Next MD Visit Not reported    Prior Therapy Not reported      Precautions   Precautions None      Restrictions   Weight Bearing Restrictions No      Balance Screen   Has Teresa patient fallen in Teresa past 6 months No      Prior Function   Level of Independence Independent      Cognition   Overall Cognitive Status Within Functional Limits for tasks assessed                  Objective measurements completed on examination: See above findings.          Plan - 10/05/21 1422     Clinical Impression Statement Short is a plesant 56 y/o  female referred for occipital neuralgia. Short evaluation revealed deficits in cervical ROM and pain. She would benefit from skilled Short services to address these deficits and return to pain free function to increase quality of life    Personal Factors and Comorbidities Time since onset of injury/illness/exacerbation    Examination-Activity Limitations Sleep    Stability/Clinical Decision Making Stable/Uncomplicated    Clinical Decision Making Low    Rehab Potential Teresa    Short Frequency 2x / week    Short Duration 6 weeks    Short Treatment/Interventions Cryotherapy;Electrical Stimulation;Iontophoresis 4mg /ml Dexamethasone;Moist Heat;Traction;Ultrasound;Therapeutic activities;Therapeutic exercise;Neuromuscular re-education;Manual techniques;Passive range of motion;Dry needling;Vestibular;Spinal Manipulations;Joint  Manipulations;Canalith Repostioning    Short Next Visit Plan STM and/or needling, traction, PAM, PROM, create HEP    Short Home Exercise Plan None currently    Consulted and Agree with Plan of Care Patient             Patient will benefit from skilled therapeutic intervention in order to improve Teresa following deficits and impairments:  Decreased range of motion, Hypomobility, Pain  Visit Diagnosis: Occipital neuralgia of right side  Cervicalgia     Problem List Patient Active Problem List   Diagnosis Date Noted   History of renal cell carcinoma 02/29/2020   Family history of breast cancer 02/29/2020   Family history of ovarian cancer 02/29/2020   Pernicious anemia 02/28/2020   Lymphocytosis 02/20/2020   B12 deficiency 02/20/2020   Elevated TSH 02/20/2020   Leukocytosis 04/28/2016   Abdominal pain, right upper quadrant 03/03/2016   Diarrhea 03/03/2016   Teresa Short 10/06/2021, 11:33 AM  [place] [address] [place], [place], [address] Phone: [phone]   Fax:  [phone]  Name: Teresa Short MRN: [id number] Date of Birth: May 19, 1966
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
## 2021-10-07 ENCOUNTER — Other Ambulatory Visit: Payer: Self-pay

## 2021-10-07 ENCOUNTER — Ambulatory Visit: Payer: Commercial Managed Care - PPO

## 2021-10-07 DIAGNOSIS — M542 Cervicalgia: Secondary | ICD-10-CM

## 2021-10-07 DIAGNOSIS — M5481 Occipital neuralgia: Secondary | ICD-10-CM
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
## 2021-10-08 NOTE — Patient Instructions (Signed)
Access Code: [email or web address] URL: [email or web address] Date: 10/07/2021 Prepared by: Roxana Hires  Exercises Supine Chin Tuck - 1 x daily - 7 x weekly - 3 sets - 10 reps Seated Gentle Upper Trapezius Stretch - 2 x daily - 7 x weekly - 10 reps - 30-45s hold Seated Cervical Sidebending Stretch - 2 x daily - 7 x weekly - 10 reps - 30-45s hold

## 2021-10-12 ENCOUNTER — Other Ambulatory Visit: Payer: Self-pay

## 2021-10-12 ENCOUNTER — Ambulatory Visit: Payer: Commercial Managed Care - PPO

## 2021-10-12 DIAGNOSIS — M5481 Occipital neuralgia: Secondary | ICD-10-CM | POA: Diagnosis not present

## 2021-10-12 DIAGNOSIS — M542 Cervicalgia: Secondary | ICD-10-CM
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
## 2021-10-13 ENCOUNTER — Inpatient Hospital Stay: Payer: Commercial Managed Care - PPO | Attending: Nurse Practitioner

## 2021-10-13 DIAGNOSIS — E538 Deficiency of other specified B group vitamins: Secondary | ICD-10-CM | POA: Insufficient documentation

## 2021-10-13 MED ORDER — CYANOCOBALAMIN 1000 MCG/ML IJ SOLN
1000.0000 ug | Freq: Once | INTRAMUSCULAR | Status: AC
  Administered 2021-10-13: 1000 ug via INTRAMUSCULAR
  Filled 2021-10-13: qty 1

## 2021-10-19 ENCOUNTER — Ambulatory Visit: Payer: Commercial Managed Care - PPO

## 2021-10-21 ENCOUNTER — Ambulatory Visit: Payer: Commercial Managed Care - PPO
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
## 2021-10-26 ENCOUNTER — Ambulatory Visit: Payer: Commercial Managed Care - PPO | Attending: Neurology

## 2021-10-26 ENCOUNTER — Other Ambulatory Visit: Payer: Self-pay

## 2021-10-26 DIAGNOSIS — M542 Cervicalgia: Secondary | ICD-10-CM | POA: Insufficient documentation

## 2021-10-28 ENCOUNTER — Ambulatory Visit: Payer: Commercial Managed Care - PPO

## 2021-10-28 ENCOUNTER — Other Ambulatory Visit: Payer: Self-pay

## 2021-10-28 DIAGNOSIS — M542 Cervicalgia: Secondary | ICD-10-CM | POA: Diagnosis not present
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
## 2021-11-10 ENCOUNTER — Other Ambulatory Visit: Payer: Self-pay

## 2021-11-10 ENCOUNTER — Inpatient Hospital Stay: Payer: Commercial Managed Care - PPO | Attending: Nurse Practitioner

## 2021-11-10 DIAGNOSIS — E538 Deficiency of other specified B group vitamins: Secondary | ICD-10-CM | POA: Insufficient documentation

## 2021-11-10 MED ORDER — CYANOCOBALAMIN 1000 MCG/ML IJ SOLN
1000.0000 ug | Freq: Once | INTRAMUSCULAR | Status: AC
  Administered 2021-11-10: 1000 ug via INTRAMUSCULAR
  Filled 2021-11-10: qty 1

## 2021-11-26 ENCOUNTER — Encounter: Payer: Self-pay | Admitting: Hematology and Oncology

## 2021-12-15 ENCOUNTER — Inpatient Hospital Stay: Payer: Commercial Managed Care - PPO

## 2021-12-16 ENCOUNTER — Inpatient Hospital Stay: Payer: Commercial Managed Care - PPO | Attending: Nurse Practitioner

## 2021-12-16 DIAGNOSIS — E538 Deficiency of other specified B group vitamins: Secondary | ICD-10-CM | POA: Diagnosis not present

## 2021-12-16 MED ORDER — CYANOCOBALAMIN 1000 MCG/ML IJ SOLN
1000.0000 ug | Freq: Once | INTRAMUSCULAR | Status: AC
  Administered 2021-12-16: 1000 ug via INTRAMUSCULAR
  Filled 2021-12-16: qty 1

## 2022-01-12 ENCOUNTER — Inpatient Hospital Stay: Payer: Commercial Managed Care - PPO | Attending: Nurse Practitioner

## 2022-01-12 DIAGNOSIS — E538 Deficiency of other specified B group vitamins: Secondary | ICD-10-CM | POA: Diagnosis present

## 2022-01-12 MED ORDER — CYANOCOBALAMIN 1000 MCG/ML IJ SOLN
1000.0000 ug | Freq: Once | INTRAMUSCULAR | Status: AC
  Administered 2022-01-12: 1000 ug via INTRAMUSCULAR
  Filled 2022-01-12: qty 1

## 2022-02-16 ENCOUNTER — Inpatient Hospital Stay: Payer: Commercial Managed Care - PPO

## 2022-02-16 ENCOUNTER — Other Ambulatory Visit: Payer: Self-pay | Admitting: Family Medicine

## 2022-02-16 DIAGNOSIS — Z1231 Encounter for screening mammogram for malignant neoplasm of breast: Secondary | ICD-10-CM

## 2022-02-17 ENCOUNTER — Inpatient Hospital Stay: Payer: Commercial Managed Care - PPO | Attending: Nurse Practitioner

## 2022-03-09 ENCOUNTER — Ambulatory Visit
Admission: RE | Admit: 2022-03-09 | Discharge: 2022-03-09 | Disposition: A | Discharge status: Home / Self Care | Payer: Commercial Managed Care - PPO | Source: Ambulatory Visit | Attending: Family Medicine | Admitting: Family Medicine

## 2022-03-09 DIAGNOSIS — Z1231 Encounter for screening mammogram for malignant neoplasm of breast: Secondary | ICD-10-CM | POA: Insufficient documentation

## 2022-03-16 ENCOUNTER — Inpatient Hospital Stay: Payer: Commercial Managed Care - PPO

## 2022-03-17 ENCOUNTER — Inpatient Hospital Stay: Payer: Commercial Managed Care - PPO | Attending: Nurse Practitioner

## 2022-03-17 DIAGNOSIS — E538 Deficiency of other specified B group vitamins: Secondary | ICD-10-CM | POA: Insufficient documentation

## 2022-03-17 MED ORDER — CYANOCOBALAMIN 1000 MCG/ML IJ SOLN
1000.0000 ug | Freq: Once | INTRAMUSCULAR | Status: AC
  Administered 2022-03-17: 1000 ug via INTRAMUSCULAR
  Filled 2022-03-17: qty 1

## 2022-04-13 ENCOUNTER — Inpatient Hospital Stay: Payer: Commercial Managed Care - PPO | Attending: Nurse Practitioner

## 2022-04-13 DIAGNOSIS — E538 Deficiency of other specified B group vitamins: Secondary | ICD-10-CM | POA: Insufficient documentation

## 2022-04-13 MED ORDER — CYANOCOBALAMIN 1000 MCG/ML IJ SOLN
1000.0000 ug | Freq: Once | INTRAMUSCULAR | Status: AC
  Administered 2022-04-13: 1000 ug via INTRAMUSCULAR
  Filled 2022-04-13: qty 1

## 2022-05-18 ENCOUNTER — Inpatient Hospital Stay: Payer: Commercial Managed Care - PPO | Attending: Nurse Practitioner

## 2022-05-18 DIAGNOSIS — E538 Deficiency of other specified B group vitamins: Secondary | ICD-10-CM | POA: Insufficient documentation

## 2022-05-18 MED ORDER — CYANOCOBALAMIN 1000 MCG/ML IJ SOLN
1000.0000 ug | Freq: Once | INTRAMUSCULAR | Status: AC
  Administered 2022-05-18: 1000 ug via INTRAMUSCULAR
  Filled 2022-05-18: qty 1

## 2022-06-15 ENCOUNTER — Inpatient Hospital Stay: Payer: Commercial Managed Care - PPO | Attending: Nurse Practitioner

## 2022-07-13 ENCOUNTER — Inpatient Hospital Stay: Payer: Commercial Managed Care - PPO | Attending: Nurse Practitioner

## 2022-08-19 ENCOUNTER — Encounter: Payer: Self-pay | Admitting: Oncology

## 2022-08-19 ENCOUNTER — Inpatient Hospital Stay (HOSPITAL_BASED_OUTPATIENT_CLINIC_OR_DEPARTMENT_OTHER): Payer: Commercial Managed Care - PPO | Admitting: Oncology

## 2022-08-19 ENCOUNTER — Inpatient Hospital Stay: Payer: Commercial Managed Care - PPO | Attending: Nurse Practitioner

## 2022-08-19 ENCOUNTER — Other Ambulatory Visit: Payer: Self-pay | Admitting: *Deleted

## 2022-08-19 ENCOUNTER — Inpatient Hospital Stay: Payer: Commercial Managed Care - PPO

## 2022-08-19 VITALS — BP 154/97 | HR 76 | Temp 98.5°F | Wt 236.0 lb

## 2022-08-19 DIAGNOSIS — D72820 Lymphocytosis (symptomatic): Secondary | ICD-10-CM

## 2022-08-19 DIAGNOSIS — D510 Vitamin B12 deficiency anemia due to intrinsic factor deficiency: Secondary | ICD-10-CM

## 2022-08-19 DIAGNOSIS — Z87891 Personal history of nicotine dependence: Secondary | ICD-10-CM | POA: Diagnosis not present

## 2022-08-19 DIAGNOSIS — E538 Deficiency of other specified B group vitamins: Secondary | ICD-10-CM

## 2022-08-19 LAB — CBC WITH DIFFERENTIAL/PLATELET
Abs Immature Granulocytes: 0.05 10*3/uL (ref 0.00–0.07)
Basophils Absolute: 0.1 10*3/uL (ref 0.0–0.1)
Basophils Relative: 1 %
Eosinophils Absolute: 0.3 10*3/uL (ref 0.0–0.5)
Eosinophils Relative: 2 %
HCT: 36 % (ref 36.0–46.0)
Hemoglobin: 12.1 g/dL (ref 12.0–15.0)
Immature Granulocytes: 0 %
Lymphocytes Relative: 29 %
Lymphs Abs: 3.9 10*3/uL (ref 0.7–4.0)
MCH: 28.9 pg (ref 26.0–34.0)
MCHC: 33.6 g/dL (ref 30.0–36.0)
MCV: 85.9 fL (ref 80.0–100.0)
Monocytes Absolute: 0.8 10*3/uL (ref 0.1–1.0)
Monocytes Relative: 6 %
Neutro Abs: 8.2 10*3/uL — ABNORMAL HIGH (ref 1.7–7.7)
Neutrophils Relative %: 62 %
Platelets: 297 10*3/uL (ref 150–400)
RBC: 4.19 MIL/uL (ref 3.87–5.11)
RDW: 13.1 % (ref 11.5–15.5)
WBC: 13.2 10*3/uL — ABNORMAL HIGH (ref 4.0–10.5)
nRBC: 0 % (ref 0.0–0.2)

## 2022-08-19 LAB — FOLATE: Folate: 21.4 ng/mL (ref >5.9)

## 2022-08-19 LAB — VITAMIN B12: Vitamin B-12: 528 pg/mL (ref 180–914)

## 2022-08-19 MED ORDER — CYANOCOBALAMIN 1000 MCG/ML IJ SOLN
1000.0000 ug | Freq: Once | INTRAMUSCULAR | Status: AC
  Administered 2022-08-19: 1000 ug via INTRAMUSCULAR
  Filled 2022-08-19: qty 1

## 2022-08-19 MED ORDER — CYANOCOBALAMIN 1000 MCG/ML IJ SOLN: 1000.0000 ug | INTRAMUSCULAR | 12 refills | Status: DC

## 2022-08-19 MED ORDER — "SYRINGE 25G X 1"" 3 ML MISC": 1.0000 | 0 refills | Status: AC

## 2022-08-19 NOTE — Progress Notes (Signed)
Patient is here for follow-up and has no new concerns today/.

## 2022-08-19 NOTE — Progress Notes (Signed)
Hematology/Oncology Consult note [facility]  [phone] [phone]  Patient Care Team: Valera Castle, MD as PCP - General (Family Medicine) Lollie Sails, MD (Inactive) as Consulting Physician (Gastroenterology) Bary Castilla Forest Gleason, MD (General Surgery) Lequita Asal, MD (Inactive) as Referring Physician (Hematology and Oncology) Jacquelin Hawking, NP as Nurse Practitioner (Oncology)   Name of the patient: Teresa Short  [id number]  1966-01-18   Date of visit: 08/19/22  Diagnosis-routine follow-up of lymphocytosis and B12 deficiency anemia  Chief complaint/ Reason for visit-routine follow-up of lymphocytosis  Heme/Onc history: Patient is a 56 year old female who has previously seen Dr. Mike Gip for lymphocytosis and now transferring care to me.  Patient's white cell count has been fluctuating between 9-13.  Differential mainly showed intermittent lymphocytosis.  Flow cytometry showed no significant immunophenotypic abnormality.  Patient also has a history of pernicious anemia and is on oral B12 and B12 injections.  She has a history of stage I renal cell carcinoma s/p left partial nephrectomy in 2015 at [facility].  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  Pathologic stage was T1a NX.  She has a history of seizure disorder since 2021 for which she is on Keppra.  Interval history-patient is doing well presently and denies any specific complaints.  Appetite and weight have remainedStable.  ECOG PS- 0 Pain scale- 0  Review of systems- Review of Systems  Constitutional:  Negative for chills, fever, malaise/fatigue and weight loss.  HENT:  Negative for congestion, ear discharge and nosebleeds.   Eyes:  Negative for blurred vision.  Respiratory:  Negative for cough, hemoptysis, sputum production, shortness of breath and wheezing.   Cardiovascular:  Negative for chest pain,  palpitations, orthopnea and claudication.  Gastrointestinal:  Negative for abdominal pain, blood in stool, constipation, diarrhea, heartburn, melena, nausea and vomiting.  Genitourinary:  Negative for dysuria, flank pain, frequency, hematuria and urgency.  Musculoskeletal:  Negative for back pain, joint pain and myalgias.  Skin:  Negative for rash.  Neurological:  Negative for dizziness, tingling, focal weakness, seizures, weakness and headaches.  Endo/Heme/Allergies:  Does not bruise/bleed easily.  Psychiatric/Behavioral:  Negative for depression and suicidal ideas. The patient does not have insomnia.       Allergies  Allergen Reactions   Latex Hives    Itching and hives   Dm-Apap-Cpm Other (See Comments)    Unknown, childhood allergy Unknown, childhood allergy   Lisinopril Cough   Other Other (See Comments)    Unknown, childhood allergy     Past Medical History:  Diagnosis Date   Anemia    Cancer ([place]) 2015   Renal cell-tumor removed   Chickenpox    Chronic kidney disease    Gastritis    EROSIVE   Hyperlipidemia    Hypertension    Hypothyroidism    Migraine    Polycystic ovary    Uterine fibroid      Past Surgical History:  Procedure Laterality Date   APPENDECTOMY  June 2015   Dr Alesia Morin RELEASE Bilateral [id number]   COLONOSCOPY WITH PROPOFOL N/A 07/07/2016   Procedure: COLONOSCOPY WITH PROPOFOL;  Surgeon: Lollie Sails, MD;  Location: [facility] ENDOSCOPY;  Service: Endoscopy;  Laterality: N/A;   DILATION AND CURETTAGE OF UTERUS     hysteroscopy,ablation   ESOPHAGOGASTRODUODENOSCOPY N/A 02/24/2015   Procedure: ESOPHAGOGASTRODUODENOSCOPY (EGD);  Surgeon: Lollie Sails, MD;  [facility] ENDOSCOPY;  Service: Endoscopy;  Laterality: N/A;  ESOPHAGOGASTRODUODENOSCOPY N/A 07/31/2020   Procedure: ESOPHAGOGASTRODUODENOSCOPY (EGD);  Surgeon: Lesly Rubenstein, MD;  Location: [facility] ENDOSCOPY;  Service: Endoscopy;  Laterality: N/A;    ESOPHAGOGASTRODUODENOSCOPY (EGD) WITH PROPOFOL N/A 07/07/2016   Procedure: ESOPHAGOGASTRODUODENOSCOPY (EGD) WITH PROPOFOL;  Surgeon: Lollie Sails, MD;  Location[facility] ENDOSCOPY;  Service: Endoscopy;  Laterality: N/A;   FLEXOR TENOTOMY  Right 05/22/2015   Procedure: right elbow extensor origin repair;  Surgeon: Christophe Louis, MD;  Location: [facility] ORS;  Service: Orthopedics;  Laterality: Right;   PARTIAL NEPHRECTOMY  Sept 2015   20 % at [place] History   Socioeconomic History   Marital status: Married    Spouse name: Not on file   Number of children: Not on file   Years of education: Not on file   Highest education level: Not on file  Occupational History   Not on file  Tobacco Use   Smoking status: Former    Years: 25.00    Types: Cigarettes    Quit date: 11/21/2017    Years since quitting: 4.7   Smokeless tobacco: Never  Substance and Sexual Activity   Alcohol use: Yes    Alcohol/week: 0.0 standard drinks of alcohol    Comment: socially   Drug use: No   Sexual activity: Not on file  Other Topics Concern   Not on file  Social History Narrative   Not on file   Social Determinants of Health   Financial Resource Strain: Not on file  Food Insecurity: Not on file  Transportation Needs: Not on file  Physical Activity: Not on file  Stress: Not on file  Social Connections: Not on file  Intimate Partner Violence: Not on file    Family History  Problem Relation Age of Onset   Breast cancer Mother 57   Breast cancer Maternal Aunt 31     Current Outpatient Medications:    budesonide-formoterol (SYMBICORT) 80-4.5 MCG/ACT inhaler, Inhale into the lungs., Disp: , Rfl:    busPIRone (BUSPAR) 5 MG tablet, Take 1 tablet by mouth 2 (two) times daily., Disp: , Rfl:    citalopram (CELEXA) 40 MG tablet, Take 40 mg by mouth daily., Disp: , Rfl:    levETIRAcetam (KEPPRA) 500 MG tablet, Take by mouth., Disp: , Rfl:    levothyroxine (SYNTHROID) 112 MCG  tablet, Take 112 mcg by mouth daily., Disp: , Rfl:    montelukast (SINGULAIR) 10 MG tablet, Take 10 mg by mouth at bedtime., Disp: , Rfl:    omeprazole (PRILOSEC) 20 MG capsule, Take by mouth., Disp: , Rfl:    traZODone (DESYREL) 50 MG tablet, Take 50 mg by mouth at bedtime., Disp: , Rfl:    albuterol (PROVENTIL HFA;VENTOLIN HFA) 108 (90 Base) MCG/ACT inhaler, Inhale 2 puffs into the lungs every 6 (six) hours as needed for wheezing or shortness of breath. (Patient not taking: Reported on 08/19/2022), Disp: 1 Inhaler, Rfl: 2   cetirizine (ZYRTEC) 10 MG tablet, Take 10 mg by mouth at bedtime.  (Patient not taking: Reported on 08/19/2022), Disp: , Rfl:    levothyroxine (SYNTHROID, LEVOTHROID) 75 MCG tablet, Take 116 mcg by mouth daily before breakfast.  (Patient not taking: Reported on 08/19/2022), Disp: , Rfl:    losartan (COZAAR) 25 MG tablet, Take 25 mg by mouth daily. (Patient not taking: Reported on 08/19/2022), Disp: , Rfl:    Omega-3 Fatty Acids (FISH OIL) 1000 MG CAPS, Take 2 capsules by mouth every morning. (Patient not taking:  Reported on 08/19/2022), Disp: , Rfl:   Physical exam:  Vitals:   08/19/22 1344  BP: (!) 154/97  Pulse: 76  Temp: 98.5 F (36.9 C)  TempSrc: Tympanic  Weight: 236 lb (107 kg)   Physical Exam Cardiovascular:     Rate and Rhythm: Normal rate and regular rhythm.     Heart sounds: Normal heart sounds.  Pulmonary:     Effort: Pulmonary effort is normal.     Breath sounds: Normal breath sounds.  Skin:    General: Skin is warm and dry.  Neurological:     Mental Status: She is alert and oriented to person, place, and time.         Latest Ref Rng & Units 02/21/2020   12:09 PM  CMP  Glucose 70 - 99 mg/dL 102   BUN 6 - 20 mg/dL 11   Creatinine 0.44 - 1.00 mg/dL 0.91   Sodium 135 - 145 mmol/L 137   Potassium 3.5 - 5.1 mmol/L 4.1   Chloride 98 - 111 mmol/L 105   CO2 22 - 32 mmol/L 25   Calcium 8.9 - 10.3 mg/dL 9.1   Total Protein 6.5 - 8.1 g/dL 7.2    Total Bilirubin 0.3 - 1.2 mg/dL 0.5   Alkaline Phos 38 - 126 U/L 69   AST 15 - 41 U/L 17   ALT 0 - 44 U/L 16       Latest Ref Rng & Units 08/19/2022   12:53 PM  CBC  WBC 4.0 - 10.5 K/uL 13.2   Hemoglobin 12.0 - 15.0 g/dL 12.1   Hematocrit 36.0 - 46.0 % 36.0   Platelets 150 - 400 K/uL 297      Assessment and plan- Patient is a 56 y.o. female here for routine follow-up of following issues  Pernicious anemia: She will get her B12 injection today and we will send a prescription for B12 injections which she can self administer at home.  If she has any issues doing that she will let us know.  She can subsequently get her prescriptions from her PCP.  Leukocytosis: Intermittent lymphocytosis and neutrophilia.  Flow cytometry was unremarkable.  White count fluctuates between 11-15 without any consistent upward trend.  This does not require any further follow-up with hematology.  Patient can continue to follow-up with her PCP and be referred to [place] in the future for questions or concerns   Visit Diagnosis 1. Pernicious anemia   2. Lymphocytosis      Dr. Randa Evens, MD, MPH [facility] [phone] 08/19/2022 1:57 PM

## 2023-02-14 ENCOUNTER — Encounter: Payer: Self-pay | Admitting: Family Medicine

## 2023-02-15 ENCOUNTER — Inpatient Hospital Stay
Admission: RE | Admit: 2023-02-15 | Discharge: 2023-02-15 | Disposition: A | Discharge status: Home / Self Care | Payer: Self-pay | Source: Ambulatory Visit | Attending: Family Medicine | Admitting: Family Medicine

## 2023-02-15 ENCOUNTER — Other Ambulatory Visit: Payer: Self-pay | Admitting: Family Medicine

## 2023-02-15 ENCOUNTER — Other Ambulatory Visit: Payer: Self-pay | Admitting: *Deleted

## 2023-02-15 DIAGNOSIS — Z1231 Encounter for screening mammogram for malignant neoplasm of breast: Secondary | ICD-10-CM

## 2023-02-15 DIAGNOSIS — N63 Unspecified lump in unspecified breast: Secondary | ICD-10-CM

## 2023-03-14 ENCOUNTER — Ambulatory Visit
Admission: RE | Admit: 2023-03-14 | Discharge: 2023-03-14 | Disposition: A | Discharge status: Home / Self Care | Payer: Commercial Managed Care - PPO | Source: Ambulatory Visit | Attending: Family Medicine | Admitting: Family Medicine

## 2023-03-14 DIAGNOSIS — N63 Unspecified lump in unspecified breast: Secondary | ICD-10-CM

## 2023-09-16 ENCOUNTER — Other Ambulatory Visit: Payer: Self-pay | Admitting: Oncology

## 2023-09-27 ENCOUNTER — Encounter: Payer: Self-pay | Admitting: Hematology and Oncology

## 2023-11-14 ENCOUNTER — Other Ambulatory Visit: Payer: Self-pay | Admitting: Oncology

## 2023-11-15 ENCOUNTER — Encounter: Payer: Self-pay | Admitting: Hematology and Oncology

## 2023-11-15 ENCOUNTER — Other Ambulatory Visit: Payer: Self-pay

## 2023-11-15 MED ORDER — CYANOCOBALAMIN 1000 MCG/ML IJ SOLN: 1000.0000 ug | INTRAMUSCULAR | 12 refills | Status: AC

## 2024-05-02 ENCOUNTER — Other Ambulatory Visit: Payer: Self-pay | Admitting: Family Medicine

## 2024-05-02 DIAGNOSIS — Z1231 Encounter for screening mammogram for malignant neoplasm of breast: Secondary | ICD-10-CM

## 2024-05-30 ENCOUNTER — Ambulatory Visit
Admission: RE | Admit: 2024-05-30 | Discharge: 2024-05-30 | Disposition: A | Discharge status: Home / Self Care | Source: Ambulatory Visit | Attending: Family Medicine | Admitting: Family Medicine

## 2024-05-30 DIAGNOSIS — Z1231 Encounter for screening mammogram for malignant neoplasm of breast: Secondary | ICD-10-CM | POA: Insufficient documentation
